# Patient Record
Sex: Male | Born: 1990 | Race: Black or African American | Hispanic: No | Marital: Married | Smoking: Former smoker
Health system: Southern US, Community
[De-identification: ages and names within clinical notes are randomized; demographics above are authoritative.]

## PROBLEM LIST (undated history)

## (undated) HISTORY — PX: NECK SURGERY: SHX720

---
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
## 2012-04-12 ENCOUNTER — Emergency Department: Payer: Self-pay | Admitting: Emergency Medicine

## 2012-10-14 ENCOUNTER — Emergency Department: Payer: Self-pay | Admitting: Emergency Medicine

## 2012-12-28 ENCOUNTER — Emergency Department: Payer: Self-pay | Admitting: Emergency Medicine

## 2013-11-03 ENCOUNTER — Emergency Department: Payer: Self-pay | Admitting: Emergency Medicine

## 2014-03-02 ENCOUNTER — Emergency Department: Payer: Self-pay | Admitting: Emergency Medicine

## 2016-01-01 ENCOUNTER — Emergency Department: Payer: Self-pay

## 2016-01-01 ENCOUNTER — Encounter: Payer: Self-pay | Admitting: Emergency Medicine

## 2016-01-01 DIAGNOSIS — R0789 Other chest pain: Secondary | ICD-10-CM | POA: Insufficient documentation

## 2016-01-01 DIAGNOSIS — F17200 Nicotine dependence, unspecified, uncomplicated: Secondary | ICD-10-CM | POA: Insufficient documentation

## 2016-01-01 LAB — BASIC METABOLIC PANEL
ANION GAP: 5 (ref 5–15)
BUN: 12 mg/dL (ref 6–20)
CHLORIDE: 105 mmol/L (ref 101–111)
CO2: 28 mmol/L (ref 22–32)
Calcium: 9 mg/dL (ref 8.9–10.3)
Creatinine, Ser: 0.86 mg/dL (ref 0.61–1.24)
GFR calc Af Amer: >60 mL/min (ref >60)
GLUCOSE: 100 mg/dL — AB (ref 65–99)
POTASSIUM: 3.8 mmol/L (ref 3.5–5.1)
SODIUM: 138 mmol/L (ref 135–145)

## 2016-01-01 LAB — CBC
HEMATOCRIT: 42.5 % (ref 40.0–52.0)
HEMOGLOBIN: 13.8 g/dL (ref 13.0–18.0)
MCH: 27.4 pg (ref 26.0–34.0)
MCHC: 32.5 g/dL (ref 32.0–36.0)
MCV: 84.4 fL (ref 80.0–100.0)
Platelets: 214 10*3/uL (ref 150–440)
RBC: 5.04 MIL/uL (ref 4.40–5.90)
RDW: 13.4 % (ref 11.5–14.5)
WBC: 6.2 10*3/uL (ref 3.8–10.6)

## 2016-01-01 LAB — TROPONIN I: Troponin I: <0.03 ng/mL (ref <0.03)

## 2016-01-01 NOTE — ED Triage Notes (Signed)
Patient reports intermittent right side chest pain and shortness of breath times one week.

## 2016-01-02 ENCOUNTER — Emergency Department
Admission: EM | Admit: 2016-01-02 | Discharge: 2016-01-02 | Disposition: A | Discharge status: Home / Self Care | Payer: Self-pay | Attending: Emergency Medicine | Admitting: Emergency Medicine

## 2016-01-02 DIAGNOSIS — R079 Chest pain, unspecified: Secondary | ICD-10-CM

## 2016-01-02 MED ORDER — IBUPROFEN 600 MG PO TABS: 600.0000 mg | ORAL_TABLET | Freq: Four times a day (QID) | ORAL | 0 refills | Status: DC | PRN

## 2016-01-02 MED ORDER — CYCLOBENZAPRINE HCL 10 MG PO TABS: 10.0000 mg | ORAL_TABLET | Freq: Three times a day (TID) | ORAL | 0 refills | Status: DC | PRN

## 2016-01-02 NOTE — ED Provider Notes (Signed)
[facility] Emergency Department Provider Note  Time seen: 1:12 AM  I have reviewed the triage vital signs and the nursing notes.   HISTORY  Chief Complaint Chest Pain and Shortness of Breath    HPI Charles Rodriguez is a 25 y.o. male with no past medical history who presents the emergency department right chest pain. According to the patient one week ago he was bench pressing when he felt something pop in his chest. Since that time he has had significant pain to the right chest worse with movement. Patient states the pain worsened tonight while at work so he left work and came to the emergency department for evaluation. Patient denies any nausea, trouble breathing or diaphoresis. Describes the chest pain is mild to moderate at rest, significant when moving.  History reviewed. No pertinent past medical history.  There are no active problems to display for this patient.   History reviewed. No pertinent surgical history.  Prior to Admission medications   Not on File    No Known Allergies  No family history on file.  Social History Social History  Substance Use Topics  . Smoking status: Current Every Day Smoker  . Smokeless tobacco: Never Used  . Alcohol use Not on file    Review of Systems Constitutional: Negative for fever Cardiovascular: Positive for chest pain Respiratory: Negative for shortness of breath. Gastrointestinal: Negative for abdominal pain 10-point ROS otherwise negative.  ____________________________________________   PHYSICAL EXAM:  VITAL SIGNS: ED Triage Vitals  Enc Vitals Group     BP 01/01/16 2222 139/72     Pulse Rate 01/01/16 2222 65     Resp 01/01/16 2222 18     Temp 01/01/16 2222 98 F (36.7 C)     Temp src --      SpO2 01/01/16 2222 99 %     Weight 01/01/16 2222 172 lb (78 kg)     Height 01/01/16 2222 5\' 8"  (1.727 m)     Head Circumference --      Peak Flow --      Pain Score 01/01/16 2223 7     Pain Loc --       Pain Edu? --      Excl. in [place]? --     Constitutional: Alert and oriented. Well appearing and in no distress. Eyes: Normal exam ENT   Head: Normocephalic and atraumatic.   Mouth/Throat: Mucous membranes are moist. Cardiovascular: Normal rate, regular rhythm. No murmur Respiratory: Normal respiratory effort without tachypnea nor retractions. Breath sounds are clear. Moderate right-sided chest wall tenderness palpation.  Gastrointestinal: Soft and nontender. No distention.  Musculoskeletal: Nontender with normal range of motion in all extremities.  Neurologic:  Normal speech and language. No gross focal neurologic deficits Skin:  Skin is warm, dry and intact.  Psychiatric: Mood and affect are normal.  ____________________________________________    EKG  EKG reviewed and interpreted by myself shows normal sinus rhythm at 64 bpm, narrow QRS, normal axis, normal intervals, nonspecific ST changes most consistent with early repolarization.  ____________________________________________    RADIOLOGY  Chest x-ray is negative  ____________________________________________   INITIAL IMPRESSION / ASSESSMENT AND PLAN / ED COURSE  Pertinent labs & imaging results that were available during my care of the patient were reviewed by me and considered in my medical decision making (see chart for details).  Patient presents with right-sided chest pain times one week, worse tonight at work. Patient's labs are largely within normal limits. Troponin is  negative. Chest x-ray is clear. EKG most consistent with early repolarization. Pain is very reproducible on examination. We will discharge with Flexeril and ibuprofen and have the patient follow-up with a primary care doctor in 1 week if not better.  ____________________________________________   FINAL CLINICAL IMPRESSION(S) / ED DIAGNOSES  Chest wall pain    Harvest Dark, MD 01/02/16 [phone]

## 2016-01-02 NOTE — ED Notes (Signed)

## 2016-01-02 NOTE — ED Notes (Signed)
Pt reports intermittent pain to his right chest wall for about 2 weeks. Pt reports pain started while lifting weights and reports he heard a pop in the area. Pt reports pain worse with movement and deep inspirations. Pt respirations even and unlabored at this time and pt is talking in full and complete sentences with no difficulty at this time.

## 2016-03-28 ENCOUNTER — Emergency Department
Admission: EM | Admit: 2016-03-28 | Discharge: 2016-03-28 | Disposition: A | Discharge status: Home / Self Care | Payer: Self-pay | Attending: Emergency Medicine | Admitting: Emergency Medicine

## 2016-03-28 ENCOUNTER — Encounter: Payer: Self-pay | Admitting: Emergency Medicine

## 2016-03-28 DIAGNOSIS — F17290 Nicotine dependence, other tobacco product, uncomplicated: Secondary | ICD-10-CM | POA: Insufficient documentation

## 2016-03-28 DIAGNOSIS — K0889 Other specified disorders of teeth and supporting structures: Secondary | ICD-10-CM

## 2016-03-28 DIAGNOSIS — Z791 Long term (current) use of non-steroidal anti-inflammatories (NSAID): Secondary | ICD-10-CM | POA: Insufficient documentation

## 2016-03-28 DIAGNOSIS — K029 Dental caries, unspecified: Secondary | ICD-10-CM | POA: Insufficient documentation

## 2016-03-28 MED ORDER — TRAMADOL HCL 50 MG PO TABS: 50.0000 mg | ORAL_TABLET | Freq: Four times a day (QID) | ORAL | 0 refills | Status: DC | PRN

## 2016-03-28 MED ORDER — IBUPROFEN 600 MG PO TABS
600.0000 mg | ORAL_TABLET | Freq: Once | ORAL | Status: AC
  Administered 2016-03-28: 600 mg via ORAL
  Filled 2016-03-28: qty 1

## 2016-03-28 MED ORDER — MAGIC MOUTHWASH
5.0000 mL | Freq: Once | ORAL | Status: AC
  Administered 2016-03-28: 5 mL via ORAL
  Filled 2016-03-28: qty 10

## 2016-03-28 MED ORDER — CEPHALEXIN 500 MG PO CAPS: 500.0000 mg | ORAL_CAPSULE | Freq: Four times a day (QID) | ORAL | 0 refills | Status: AC

## 2016-03-28 MED ORDER — CEPHALEXIN 500 MG PO CAPS
500.0000 mg | ORAL_CAPSULE | Freq: Once | ORAL | Status: AC
  Administered 2016-03-28: 500 mg via ORAL
  Filled 2016-03-28: qty 1

## 2016-03-28 MED ORDER — MAGIC MOUTHWASH: 5.0000 mL | Freq: Four times a day (QID) | ORAL | 0 refills | Status: DC | PRN

## 2016-03-28 NOTE — ED Triage Notes (Signed)
Pt presents to ED with right sided tooth pain for the past 2 days. Pain worse tonight that woke him up from sleep. Slight swelling noted. Hx of the same.

## 2016-03-28 NOTE — ED Provider Notes (Signed)
[facility] Emergency Department Provider Note   ____________________________________________   First MD Initiated Contact with Patient 03/28/16 [phone]     (approximate)  I have reviewed the triage vital signs and the nursing notes.   HISTORY  Chief Complaint Dental Pain    HPI Charles Rodriguez is a 25 y.o. male who comes into the hospital today with a toothache. He reports it's hurting very bad on the right side. He reports it has been hurting for a few days and it woke him up out of sleep tonight. Patient thinks his face is swollen. He's had no fevers, nausea or vomiting. He doesn't have a dentist. He has not had any difficulty swelling but reports it hurts to chew on that side. He rates his pain a 10 out of 10 in intensity. He has not taken anything for pain at home. He is here this evening for evaluation   History reviewed. No pertinent past medical history.  There are no active problems to display for this patient.   Past Surgical History:  Procedure Laterality Date  . NECK SURGERY      Prior to Admission medications   Medication Sig Start Date End Date Taking? Authorizing Provider  cephALEXin (KEFLEX) 500 MG capsule Take 1 capsule (500 mg total) by mouth 4 (four) times daily. 03/28/16 04/02/16  Loney Hering, MD  cyclobenzaprine (FLEXERIL) 10 MG tablet Take 1 tablet (10 mg total) by mouth 3 (three) times daily as needed for muscle spasms. 01/02/16   Harvest Dark, MD  ibuprofen (ADVIL,MOTRIN) 600 MG tablet Take 1 tablet (600 mg total) by mouth every 6 (six) hours as needed. 01/02/16   Harvest Dark, MD  magic mouthwash SOLN Take 5 mLs by mouth 4 (four) times daily as needed for mouth pain. 03/28/16   Loney Hering, MD  traMADol (ULTRAM) 50 MG tablet Take 1 tablet (50 mg total) by mouth every 6 (six) hours as needed. 03/28/16   Loney Hering, MD    Allergies Patient has no known allergies.  No family history on file.  Social  History Social History  Substance Use Topics  . Smoking status: Current Every Day Smoker    Types: Cigars  . Smokeless tobacco: Never Used  . Alcohol use Yes    Review of Systems Constitutional: No fever/chills Eyes: No visual changes. ENT: Dental pain Cardiovascular: Denies chest pain. Respiratory: Denies shortness of breath. Gastrointestinal: No abdominal pain.  No nausea, no vomiting.  No diarrhea.  No constipation. Genitourinary: Negative for dysuria. Musculoskeletal: Negative for back pain. Skin: Negative for rash. Neurological: Negative for headaches, focal weakness or numbness.  10-point ROS otherwise negative.  ____________________________________________   PHYSICAL EXAM:  VITAL SIGNS: ED Triage Vitals  Enc Vitals Group     BP 03/28/16 0233 (!) 148/82     Pulse Rate 03/28/16 0233 71     Resp 03/28/16 0233 20     Temp 03/28/16 0233 98.6 F (37 C)     Temp Source 03/28/16 0233 Oral     SpO2 03/28/16 0233 99 %     Weight 03/28/16 0234 174 lb (78.9 kg)     Height 03/28/16 0234 5\' 8"  (1.727 m)     Head Circumference --      Peak Flow --      Pain Score 03/28/16 0233 10     Pain Loc --      Pain Edu? --      Excl. in [place]? --  Constitutional: Alert and oriented. Well appearing and in Moderate distress. Eyes: Conjunctivae are normal. PERRL. EOMI. Head: Atraumatic. Nose: No congestion/rhinnorhea. Mouth/Throat: Mucous membranes are moist.  Oropharynx non-erythematous. Poor dentition with tenderness to palpation to right third molar with some significant decay. Cardiovascular: Normal rate, regular rhythm. Grossly normal heart sounds.  Good peripheral circulation. Respiratory: Normal respiratory effort.  No retractions. Lungs CTAB. Gastrointestinal: Soft and nontender. No distention. Positive bowel sounds Musculoskeletal: No lower extremity tenderness nor edema.   Neurologic:  Normal speech and language.  Skin:  Skin is warm, dry and intact. Marland Kitchen Psychiatric:  Mood and affect are normal.   ____________________________________________   LABS (all labs ordered are listed, but only abnormal results are displayed)  Labs Reviewed - No data to display ____________________________________________  EKG  none ____________________________________________  RADIOLOGY  none ____________________________________________   PROCEDURES  Procedure(s) performed: None  Procedures  Critical Care performed: No  ____________________________________________   INITIAL IMPRESSION / ASSESSMENT AND PLAN / ED COURSE  Pertinent labs & imaging results that were available during my care of the patient were reviewed by me and considered in my medical decision making (see chart for details).  This is a 25 year old male who comes into the hospital today with dental pain. He does have poor dentition and needs to be seen by a dentist. I'll give the patient dose of Keflex as well as Magic mouthwash and some ibuprofen. I will discharge the patient home to follow-up with the dentist for further evaluation of his dental decay.  Clinical Course      ____________________________________________   FINAL CLINICAL IMPRESSION(S) / ED DIAGNOSES  Final diagnoses:  Pain, dental  Dental caries      NEW MEDICATIONS STARTED DURING THIS VISIT:  Discharge Medication List as of 03/28/2016  7:26 AM    START taking these medications   Details  cephALEXin (KEFLEX) 500 MG capsule Take 1 capsule (500 mg total) by mouth 4 (four) times daily., Starting Tue 03/28/2016, Until Sun 04/02/2016, Print    magic mouthwash SOLN Take 5 mLs by mouth 4 (four) times daily as needed for mouth pain., Starting Tue 03/28/2016, Print    traMADol (ULTRAM) 50 MG tablet Take 1 tablet (50 mg total) by mouth every 6 (six) hours as needed., Starting Tue 03/28/2016, Print         Note:  This document was prepared using Dragon voice recognition software and may include unintentional  dictation errors.    Loney Hering, MD 03/28/16 [phone]

## 2016-09-11 ENCOUNTER — Emergency Department
Admission: EM | Admit: 2016-09-11 | Discharge: 2016-09-11 | Disposition: A | Discharge status: Home / Self Care | Payer: Self-pay | Attending: Emergency Medicine | Admitting: Emergency Medicine

## 2016-09-11 ENCOUNTER — Encounter: Payer: Self-pay | Admitting: Emergency Medicine

## 2016-09-11 DIAGNOSIS — F17290 Nicotine dependence, other tobacco product, uncomplicated: Secondary | ICD-10-CM | POA: Insufficient documentation

## 2016-09-11 DIAGNOSIS — Z791 Long term (current) use of non-steroidal anti-inflammatories (NSAID): Secondary | ICD-10-CM | POA: Insufficient documentation

## 2016-09-11 DIAGNOSIS — K0889 Other specified disorders of teeth and supporting structures: Secondary | ICD-10-CM | POA: Insufficient documentation

## 2016-09-11 MED ORDER — LIDOCAINE VISCOUS 2 % MT SOLN
OROMUCOSAL | Status: AC
  Filled 2016-09-11: qty 15

## 2016-09-11 MED ORDER — LIDOCAINE VISCOUS 2 % MT SOLN
15.0000 mL | Freq: Once | OROMUCOSAL | Status: AC
  Administered 2016-09-11: 15 mL via OROMUCOSAL

## 2016-09-11 MED ORDER — IBUPROFEN 600 MG PO TABS
ORAL_TABLET | ORAL | Status: AC
  Filled 2016-09-11: qty 1

## 2016-09-11 MED ORDER — LIDOCAINE VISCOUS 2 % MT SOLN: 5.0000 mL | Freq: Four times a day (QID) | OROMUCOSAL | 0 refills | Status: DC | PRN

## 2016-09-11 MED ORDER — TRAMADOL HCL 50 MG PO TABS: 50.0000 mg | ORAL_TABLET | Freq: Four times a day (QID) | ORAL | 0 refills | Status: DC | PRN

## 2016-09-11 MED ORDER — IBUPROFEN 600 MG PO TABS: 600.0000 mg | ORAL_TABLET | Freq: Four times a day (QID) | ORAL | 0 refills | Status: DC | PRN

## 2016-09-11 MED ORDER — IBUPROFEN 600 MG PO TABS
600.0000 mg | ORAL_TABLET | Freq: Once | ORAL | Status: AC
  Administered 2016-09-11: 600 mg via ORAL

## 2016-09-11 MED ORDER — AMOXICILLIN 500 MG PO CAPS: 500.0000 mg | ORAL_CAPSULE | Freq: Three times a day (TID) | ORAL | 0 refills | Status: DC

## 2016-09-11 NOTE — ED Triage Notes (Signed)
L lower dental pain x 5 days.

## 2016-09-11 NOTE — ED Notes (Signed)
See triage note   States he developed tooth pain about 4-5 days ago  States tooth is loose not sure if it has a cavity

## 2016-09-11 NOTE — ED Provider Notes (Signed)
[facility] Emergency Department Provider Note   ____________________________________________   None    (approximate)  I have reviewed the triage vital signs and the nursing notes.   HISTORY  Chief Complaint Dental Pain    HPI Charles Rodriguez is a 26 y.o. male patient complain of dental pain for 5 days. Patient state has devitalized and loose tooth left lower molar area.Patient rates his pain as a 10 over 10. Patient described a pain as "sharp". Patient states no relief with over-the-counter anti-inflammatory medications.   History reviewed. No pertinent past medical history.  There are no active problems to display for this patient.   Past Surgical History:  Procedure Laterality Date  . NECK SURGERY      Prior to Admission medications   Medication Sig Start Date End Date Taking? Authorizing Provider  amoxicillin (AMOXIL) 500 MG capsule Take 1 capsule (500 mg total) by mouth 3 (three) times daily. 09/11/16   Sable Feil, PA-C  cyclobenzaprine (FLEXERIL) 10 MG tablet Take 1 tablet (10 mg total) by mouth 3 (three) times daily as needed for muscle spasms. 01/02/16   Harvest Dark, MD  ibuprofen (ADVIL,MOTRIN) 600 MG tablet Take 1 tablet (600 mg total) by mouth every 6 (six) hours as needed. 01/02/16   Harvest Dark, MD  ibuprofen (ADVIL,MOTRIN) 600 MG tablet Take 1 tablet (600 mg total) by mouth every 6 (six) hours as needed. 09/11/16   Sable Feil, PA-C  lidocaine (XYLOCAINE) 2 % solution Use as directed 5 mLs in the mouth or throat every 6 (six) hours as needed for mouth pain. 09/11/16   Sable Feil, PA-C  magic mouthwash SOLN Take 5 mLs by mouth 4 (four) times daily as needed for mouth pain. 03/28/16   Loney Hering, MD  traMADol (ULTRAM) 50 MG tablet Take 1 tablet (50 mg total) by mouth every 6 (six) hours as needed. 03/28/16   Loney Hering, MD  traMADol (ULTRAM) 50 MG tablet Take 1 tablet (50 mg total) by mouth every 6  (six) hours as needed for moderate pain. 09/11/16   Sable Feil, PA-C    Allergies Patient has no known allergies.  No family history on file.  Social History Social History  Substance Use Topics  . Smoking status: Current Every Day Smoker    Types: Cigars  . Smokeless tobacco: Never Used  . Alcohol use Yes    Review of Systems  Constitutional: No fever/chills Eyes: No visual changes. ENT: No sore throat. Dental pain Cardiovascular: Denies chest pain. Respiratory: Denies shortness of breath. Gastrointestinal: No abdominal pain.  No nausea, no vomiting.  No diarrhea.  No constipation. Genitourinary: Negative for dysuria. Musculoskeletal: Negative for back pain. Skin: Negative for rash. Neurological: Negative for headaches, focal weakness or numbness.  ____________________________________________   PHYSICAL EXAM:  VITAL SIGNS: ED Triage Vitals [09/11/16 0703]  Enc Vitals Group     BP 128/63     Pulse Rate 70     Resp 18     Temp 97.9 F (36.6 C)     Temp Source Oral     SpO2 96 %     Weight 169 lb (76.7 kg)     Height 5\' 8"  (1.727 m)     Head Circumference      Peak Flow      Pain Score 10     Pain Loc      Pain Edu?      Excl. in [place]?  Constitutional: Alert and oriented. Well appearing and in no acute distress. Eyes: Conjunctivae are normal. PERRL. EOMI. Head: Atraumatic. Nose: No congestion/rhinnorhea. Mouth/Throat: Mucous membranes are moist.  Oropharynx non-erythematous. Devitalized tooth #18 Neck: No stridor.  No cervical spine tenderness to palpation. Hematological/Lymphatic/Immunilogical: No cervical lymphadenopathy. Cardiovascular: Normal rate, regular rhythm. Grossly normal heart sounds.  Good peripheral circulation. Respiratory: Normal respiratory effort.  No retractions. Lungs CTAB. Gastrointestinal: Soft and nontender. No distention. No abdominal bruits. No CVA tenderness. Musculoskeletal: No lower extremity tenderness nor edema.  No  joint effusions. Neurologic:  Normal speech and language. No gross focal neurologic deficits are appreciated. No gait instability. Skin:  Skin is warm, dry and intact. No rash noted. Psychiatric: Mood and affect are normal. Speech and behavior are normal.  ____________________________________________   LABS (all labs ordered are listed, but only abnormal results are displayed)  Labs Reviewed - No data to display ____________________________________________  EKG   ____________________________________________  RADIOLOGY   ____________________________________________   PROCEDURES  Procedure(s) performed: None  Procedures  Critical Care performed: No  ____________________________________________   INITIAL IMPRESSION / ASSESSMENT AND PLAN / ED COURSE  Pertinent labs & imaging results that were available during my care of the patient were reviewed by me and considered in my medical decision making (see chart for details).  Dental pain secondary to devitalize tooth #18. Patient given discharge care instructions. Patient advised to follow-up this morning with the walk-in dental clinic.      ____________________________________________   FINAL CLINICAL IMPRESSION(S) / ED DIAGNOSES  Final diagnoses:  Pain, dental      NEW MEDICATIONS STARTED DURING THIS VISIT:  New Prescriptions   AMOXICILLIN (AMOXIL) 500 MG CAPSULE    Take 1 capsule (500 mg total) by mouth 3 (three) times daily.   IBUPROFEN (ADVIL,MOTRIN) 600 MG TABLET    Take 1 tablet (600 mg total) by mouth every 6 (six) hours as needed.   LIDOCAINE (XYLOCAINE) 2 % SOLUTION    Use as directed 5 mLs in the mouth or throat every 6 (six) hours as needed for mouth pain.   TRAMADOL (ULTRAM) 50 MG TABLET    Take 1 tablet (50 mg total) by mouth every 6 (six) hours as needed for moderate pain.     Note:  This document was prepared using Dragon voice recognition software and may include unintentional dictation  errors.    Sable Feil, PA-C 09/11/16 [place], [place], MD 09/11/16 1330

## 2017-03-05 ENCOUNTER — Emergency Department: Payer: Self-pay

## 2017-03-05 ENCOUNTER — Encounter: Payer: Self-pay | Admitting: *Deleted

## 2017-03-05 ENCOUNTER — Emergency Department
Admission: EM | Admit: 2017-03-05 | Discharge: 2017-03-05 | Disposition: A | Discharge status: Home / Self Care | Payer: Self-pay | Attending: Emergency Medicine | Admitting: Emergency Medicine

## 2017-03-05 ENCOUNTER — Other Ambulatory Visit: Payer: Self-pay

## 2017-03-05 DIAGNOSIS — Y999 Unspecified external cause status: Secondary | ICD-10-CM | POA: Insufficient documentation

## 2017-03-05 DIAGNOSIS — S0990XA Unspecified injury of head, initial encounter: Secondary | ICD-10-CM | POA: Insufficient documentation

## 2017-03-05 DIAGNOSIS — Y9361 Activity, american tackle football: Secondary | ICD-10-CM | POA: Insufficient documentation

## 2017-03-05 DIAGNOSIS — Y929 Unspecified place or not applicable: Secondary | ICD-10-CM | POA: Insufficient documentation

## 2017-03-05 DIAGNOSIS — Z79899 Other long term (current) drug therapy: Secondary | ICD-10-CM | POA: Insufficient documentation

## 2017-03-05 DIAGNOSIS — W500XXA Accidental hit or strike by another person, initial encounter: Secondary | ICD-10-CM | POA: Insufficient documentation

## 2017-03-05 MED ORDER — ACETAMINOPHEN 500 MG PO TABS
1000.0000 mg | ORAL_TABLET | Freq: Once | ORAL | Status: AC
  Administered 2017-03-05: 1000 mg via ORAL
  Filled 2017-03-05: qty 2

## 2017-03-05 MED ORDER — ACETAMINOPHEN 500 MG PO TABS
ORAL_TABLET | ORAL | Status: AC
  Administered 2017-03-05: 1000 mg via ORAL
  Filled 2017-03-05: qty 2

## 2017-03-05 NOTE — ED Provider Notes (Signed)
[facility] Emergency Department Provider Note  ____________________________________________   First MD Initiated Contact with Patient 03/05/17 1844     (approximate)  I have reviewed the triage vital signs and the nursing notes.   HISTORY  Chief Complaint Head Injury    HPI Charles Rodriguez is a 26 y.o. male with no chronic medical problems who presents for evaluation after head injury yesterday.  He was playing football yesterday and was elbowed behind his ear on the right side.  He did not lose consciousness but felt briefly dazed.  He has had no nausea or vomiting.  He has had a persistent dull generalized headache that he describes as mild since that time but he was worried that something else was going on.  He feels like his vision was blurry briefly.  He denies nausea, vomiting, chest pain, shortness of breath.  He has had no difficulty with ambulation, no weakness or numbness in his extremities, in no confusion (confirmed by his wife).  No history of prior head injuries.  History reviewed. No pertinent past medical history.  There are no active problems to display for this patient.   Past Surgical History:  Procedure Laterality Date  . NECK SURGERY      Prior to Admission medications   Medication Sig Start Date End Date Taking? Authorizing Provider  amoxicillin (AMOXIL) 500 MG capsule Take 1 capsule (500 mg total) by mouth 3 (three) times daily. 09/11/16   Sable Feil, PA-C  cyclobenzaprine (FLEXERIL) 10 MG tablet Take 1 tablet (10 mg total) by mouth 3 (three) times daily as needed for muscle spasms. 01/02/16   Harvest Dark, MD  ibuprofen (ADVIL,MOTRIN) 600 MG tablet Take 1 tablet (600 mg total) by mouth every 6 (six) hours as needed. 01/02/16   Harvest Dark, MD  ibuprofen (ADVIL,MOTRIN) 600 MG tablet Take 1 tablet (600 mg total) by mouth every 6 (six) hours as needed. 09/11/16   Sable Feil, PA-C  lidocaine (XYLOCAINE) 2 % solution Use  as directed 5 mLs in the mouth or throat every 6 (six) hours as needed for mouth pain. 09/11/16   Sable Feil, PA-C  magic mouthwash SOLN Take 5 mLs by mouth 4 (four) times daily as needed for mouth pain. 03/28/16   Loney Hering, MD  traMADol (ULTRAM) 50 MG tablet Take 1 tablet (50 mg total) by mouth every 6 (six) hours as needed. 03/28/16   Loney Hering, MD  traMADol (ULTRAM) 50 MG tablet Take 1 tablet (50 mg total) by mouth every 6 (six) hours as needed for moderate pain. 09/11/16   Sable Feil, PA-C    Allergies Patient has no known allergies.  No family history on file.  Social History Social History   Tobacco Use  . Smoking status: Current Every Day Smoker    Types: Cigars  . Smokeless tobacco: Never Used  Substance Use Topics  . Alcohol use: Yes  . Drug use: No    Review of Systems Constitutional: No fever/chills Cardiovascular: Denies chest pain. Respiratory: Denies shortness of breath. Gastrointestinal: No abdominal pain.  No nausea, no vomiting.  No diarrhea.  No constipation. Genitourinary: Negative for dysuria. Musculoskeletal: Mild generalized headache.  Negative for neck pain.  Negative for back pain. Integumentary: Negative for rash. Neurological: Mild generalized headache.  No focal weakness or numbness.   ____________________________________________   PHYSICAL EXAM:  VITAL SIGNS: ED Triage Vitals  Enc Vitals Group     BP 03/05/17 1457 Marland Kitchen)  148/72     Pulse Rate 03/05/17 1457 71     Resp 03/05/17 1457 20     Temp 03/05/17 1457 98.6 F (37 C)     Temp Source 03/05/17 1457 Oral     SpO2 03/05/17 1457 99 %     Weight 03/05/17 1457 77.1 kg (170 lb)     Height 03/05/17 1457 1.753 m (5\' 9" )     Head Circumference --      Peak Flow --      Pain Score 03/05/17 1456 6     Pain Loc --      Pain Edu? --      Excl. in [place]? --     Constitutional: Alert and oriented. Well appearing and in no acute distress. Eyes: Conjunctivae are normal. No  "raccoon eyes". Head: Mildly tender hematoma posterior to right ear.  No evidence of skull fracture. Nose: No congestion/rhinnorhea. Neck: No stridor.  No meningeal signs.  No cervical spine tenderness to palpation. Cardiovascular: Normal rate, regular rhythm. Good peripheral circulation. Grossly normal heart sounds. Respiratory: Normal respiratory effort.  No retractions. Lungs CTAB. Gastrointestinal: Soft and nontender. No distention.  Musculoskeletal: No lower extremity tenderness nor edema. No gross deformities of extremities. Neurologic:  Normal speech and language. No gross focal neurologic deficits are appreciated.  Skin:  Skin is warm, dry and intact. No rash noted. Psychiatric: Mood and affect are normal. Speech and behavior are normal.  ____________________________________________   LABS (all labs ordered are listed, but only abnormal results are displayed)  Labs Reviewed - No data to display ____________________________________________  EKG  None - EKG not ordered by ED physician ____________________________________________  RADIOLOGY   Ct Head Wo Contrast  Result Date: 03/05/2017 CLINICAL DATA:  Posttraumatic headache after injury playing football yesterday. No loss of consciousness. EXAM: CT HEAD WITHOUT CONTRAST TECHNIQUE: Contiguous axial images were obtained from the base of the skull through the vertex without intravenous contrast. COMPARISON:  None. FINDINGS: Brain: No evidence of acute infarction, hemorrhage, hydrocephalus, extra-axial collection or mass lesion/mass effect. Vascular: No hyperdense vessel or unexpected calcification. Skull: Normal. Negative for fracture or focal lesion. Sinuses/Orbits: No acute finding. Other: None. IMPRESSION: Normal head CT. Electronically Signed   By: Marijo Conception, M.D.   On: 03/05/2017 15:19    ____________________________________________   PROCEDURES  Critical Care performed: No   Procedure(s) performed:    Procedures   ____________________________________________   INITIAL IMPRESSION / ASSESSMENT AND PLAN / ED COURSE  As part of my medical decision making, I reviewed the following data within the [place] [facility] notes reviewed and incorporated    Differential diagnosis includes skull fracture, intracranial hemorrhage, concussion, etc.  Fortunately the patient has no clinical signs of concussion other than a mild headache and his CT scan is reassuring.  He has no focal neurological deficits.  No evidence of basilar skull fracture which would require a CT temporal bone.  I had my usual and customary head injury discussion with him and gave my usual post injury precautions.  He understands and agrees with the plan.     ____________________________________________  FINAL CLINICAL IMPRESSION(S) / ED DIAGNOSES  Final diagnoses:  Minor head injury, initial encounter     MEDICATIONS GIVEN DURING THIS VISIT:  Medications  acetaminophen (TYLENOL) tablet 1,000 mg (1,000 mg Oral Given 03/05/17 1816)       Note:  This document was prepared using Dragon voice recognition software and may include unintentional dictation errors.  Hinda Kehr, MD 03/05/17 [phone]

## 2017-03-05 NOTE — Discharge Instructions (Signed)
You were seen in the Emergency Department (ED) today for a head injury.  Your evaluation was reassuring and there is no evidence that you sustained a serious injury.  Symptoms to expect from a minor head injury include nausea, mild to moderate headache, difficulty concentrating or sleeping, and mild lightheadedness.  These symptoms should improve over the next few days to weeks, but it may take many weeks before you feel back to normal.  Return to the emergency department or follow-up with your primary care doctor if your symptoms are not improving over this time.  Signs of a more serious head injury include vomiting, severe headache, excessive sleepiness or confusion, and weakness or numbness in your face, arms or legs.  Return immediately to the Emergency Department if you experience any of these more concerning symptoms.    Rest, avoid strenuous physical or mental activity, and avoid activities that could potentially result in another head injury until all your symptoms from this head injury are completely resolved for at least 2-3 weeks.  If you participate in sports, get cleared by your doctor or trainer before returning to play.  You may take ibuprofen or acetaminophen over the counter according to label instructions for mild headache or scalp soreness.

## 2017-03-05 NOTE — ED Notes (Signed)
Pt states he was at football workouts at [employer] yesterday denies LOC, pt states since has had a HA with blurry vision. Pt is ambulatory to ED35 from the [place] without any difficulty. Pt is a/ox4.Marland Kitchen

## 2017-03-05 NOTE — ED Triage Notes (Signed)
Pt was playing football yesterday when he got elbowed on the right side of his head, pt has a visible hematoma, pt denies LOC , complains of a headache

## 2017-03-05 NOTE — ED Notes (Signed)
Pt ambulatory upon discharge. Pt and family verbalized understanding of discharge instructions and follow-up care. Pt A&O x4. Skin warm and dry. VSS.

## 2017-04-06 ENCOUNTER — Emergency Department
Admission: EM | Admit: 2017-04-06 | Discharge: 2017-04-06 | Disposition: A | Discharge status: Home / Self Care | Payer: Self-pay | Attending: Emergency Medicine | Admitting: Emergency Medicine

## 2017-04-06 ENCOUNTER — Encounter: Payer: Self-pay | Admitting: Emergency Medicine

## 2017-04-06 DIAGNOSIS — M791 Myalgia, unspecified site: Secondary | ICD-10-CM | POA: Insufficient documentation

## 2017-04-06 DIAGNOSIS — R6889 Other general symptoms and signs: Secondary | ICD-10-CM

## 2017-04-06 DIAGNOSIS — R070 Pain in throat: Secondary | ICD-10-CM | POA: Insufficient documentation

## 2017-04-06 DIAGNOSIS — F17210 Nicotine dependence, cigarettes, uncomplicated: Secondary | ICD-10-CM | POA: Insufficient documentation

## 2017-04-06 DIAGNOSIS — R0981 Nasal congestion: Secondary | ICD-10-CM | POA: Insufficient documentation

## 2017-04-06 DIAGNOSIS — R05 Cough: Secondary | ICD-10-CM | POA: Insufficient documentation

## 2017-04-06 DIAGNOSIS — Z79899 Other long term (current) drug therapy: Secondary | ICD-10-CM | POA: Insufficient documentation

## 2017-04-06 MED ORDER — FLUTICASONE PROPIONATE 50 MCG/ACT NA SUSP: 2.0000 | Freq: Every day | NASAL | 0 refills | Status: DC

## 2017-04-06 MED ORDER — ACETAMINOPHEN 500 MG PO TABS: 500.0000 mg | ORAL_TABLET | Freq: Four times a day (QID) | ORAL | 0 refills | Status: DC | PRN

## 2017-04-06 MED ORDER — LIDOCAINE VISCOUS 2 % MT SOLN: 10.0000 mL | OROMUCOSAL | 0 refills | Status: DC | PRN

## 2017-04-06 MED ORDER — IBUPROFEN 600 MG PO TABS: 600.0000 mg | ORAL_TABLET | Freq: Four times a day (QID) | ORAL | 0 refills | Status: DC | PRN

## 2017-04-06 MED ORDER — BENZONATATE 100 MG PO CAPS: 100.0000 mg | ORAL_CAPSULE | Freq: Three times a day (TID) | ORAL | 0 refills | Status: AC | PRN

## 2017-04-06 NOTE — ED Provider Notes (Signed)
[facility] Emergency Department Provider Note  ____________________________________________  Time seen: Approximately 12:20 PM  I have reviewed the triage vital signs and the nursing notes.   HISTORY  Chief Complaint Flu-like Symptoms    HPI Charles Rodriguez is a 26 y.o. male presents to emergency department for evaluation of muscle aches, chills, sore throat, nasal congestion, nonproductive cough.  Patient states that he thinks he has the flu but does not know the symptoms that he came to the emergency department.  His "muscles hurt all over." He is not sure if he has had a fever but is waking up sweating.  He is eating and drinking well.  Patient did not receive flu shot this year.  He smokes 1 black cigar/day.  No sick contacts.  No nausea, vomiting, abdominal pain, you have diarrhea, constipation.   History reviewed. No pertinent past medical history.  There are no active problems to display for this patient.   Past Surgical History:  Procedure Laterality Date  . NECK SURGERY      Prior to Admission medications   Medication Sig Start Date End Date Taking? Authorizing Provider  acetaminophen (TYLENOL) 500 MG tablet Take 1 tablet (500 mg total) by mouth every 6 (six) hours as needed. 04/06/17   Laban Emperor, PA-C  amoxicillin (AMOXIL) 500 MG capsule Take 1 capsule (500 mg total) by mouth 3 (three) times daily. 09/11/16   Sable Feil, PA-C  benzonatate (TESSALON PERLES) 100 MG capsule Take 1 capsule (100 mg total) by mouth 3 (three) times daily as needed for cough. 04/06/17 04/06/18  Laban Emperor, PA-C  cyclobenzaprine (FLEXERIL) 10 MG tablet Take 1 tablet (10 mg total) by mouth 3 (three) times daily as needed for muscle spasms. 01/02/16   Harvest Dark, MD  fluticasone (FLONASE) 50 MCG/ACT nasal spray Place 2 sprays into both nostrils daily. 04/06/17 04/06/18  Laban Emperor, PA-C  ibuprofen (ADVIL,MOTRIN) 600 MG tablet Take 1 tablet (600 mg total) by  mouth every 6 (six) hours as needed. 04/06/17   Laban Emperor, PA-C  lidocaine (XYLOCAINE) 2 % solution Use as directed 10 mLs in the mouth or throat as needed for mouth pain. 04/06/17   Laban Emperor, PA-C  magic mouthwash SOLN Take 5 mLs by mouth 4 (four) times daily as needed for mouth pain. 03/28/16   Loney Hering, MD  traMADol (ULTRAM) 50 MG tablet Take 1 tablet (50 mg total) by mouth every 6 (six) hours as needed. 03/28/16   Loney Hering, MD  traMADol (ULTRAM) 50 MG tablet Take 1 tablet (50 mg total) by mouth every 6 (six) hours as needed for moderate pain. 09/11/16   Sable Feil, PA-C    Allergies Patient has no known allergies.  No family history on file.  Social History Social History   Tobacco Use  . Smoking status: Current Every Day Smoker    Types: Cigars  . Smokeless tobacco: Never Used  Substance Use Topics  . Alcohol use: Yes  . Drug use: No     Review of Systems  Cardiovascular: No chest pain. Respiratory: No SOB. Gastrointestinal: No abdominal pain.  No nausea, no vomiting.  Skin: Negative for rash, abrasions, lacerations, ecchymosis.   ____________________________________________   PHYSICAL EXAM:  VITAL SIGNS: ED Triage Vitals [04/06/17 1138]  Enc Vitals Group     BP (!) 133/91     Pulse Rate 91     Resp 16     Temp 98.9 F (37.2 C)  Temp Source Oral     SpO2 98 %     Weight 170 lb (77.1 kg)     Height 5\' 11"  (1.803 m)     Head Circumference      Peak Flow      Pain Score      Pain Loc      Pain Edu?      Excl. in [place]?      Constitutional: Alert and oriented. Well appearing and in no acute distress. Eyes: Conjunctivae are normal. PERRL. EOMI. Head: Atraumatic. ENT:      Ears:      Nose: No congestion/rhinnorhea.      Mouth/Throat: Mucous membranes are moist.  Neck: No stridor.   Cardiovascular: Normal rate, regular rhythm.  Good peripheral circulation. Respiratory: Normal respiratory effort without tachypnea or  retractions. Lungs CTAB. Good air entry to the bases with no decreased or absent breath sounds. Gastrointestinal: Bowel sounds 4 quadrants. Soft and nontender to palpation. No guarding or rigidity. No palpable masses. No distention. Musculoskeletal: Full range of motion to all extremities. No gross deformities appreciated. Neurologic:  Normal speech and language. No gross focal neurologic deficits are appreciated.  Skin:  Skin is warm, dry and intact. No rash noted.   ____________________________________________   LABS (all labs ordered are listed, but only abnormal results are displayed)  Labs Reviewed - No data to display ____________________________________________  EKG   ____________________________________________  RADIOLOGY No results found.  ____________________________________________    PROCEDURES  Procedure(s) performed:    Procedures    Medications - No data to display   ____________________________________________   INITIAL IMPRESSION / ASSESSMENT AND PLAN / ED COURSE  Pertinent labs & imaging results that were available during my care of the patient were reviewed by me and considered in my medical decision making (see chart for details).  Review of the [place] CSRS was performed in accordance of the [place] prior to dispensing any controlled drugs.   Patient's diagnosis is consistent with influenza like illness.  Vital signs and exam are reassuring.  Patient is already outside of the window to begin Tamiflu so we discussed not completing a fluid test since it will not change management.  Patient appears well.  Patient will be discharged home with prescriptions for Flonase, viscous lidocaine, Tessalon Perles, Tylenol, ibuprofen. Patient is to follow up with PCP as directed. Patient is given ED precautions to return to the ED for any worsening or new symptoms.     ____________________________________________  FINAL CLINICAL IMPRESSION(S) / ED  DIAGNOSES  Final diagnoses:  Flu-like symptoms      NEW MEDICATIONS STARTED DURING THIS VISIT:  ED Discharge Orders        Ordered    fluticasone (FLONASE) 50 MCG/ACT nasal spray  Daily     04/06/17 1228    benzonatate (TESSALON PERLES) 100 MG capsule  3 times daily PRN     04/06/17 1228    lidocaine (XYLOCAINE) 2 % solution  As needed     04/06/17 1228    ibuprofen (ADVIL,MOTRIN) 600 MG tablet  Every 6 hours PRN     04/06/17 1228    acetaminophen (TYLENOL) 500 MG tablet  Every 6 hours PRN     04/06/17 1228          This chart was dictated using voice recognition software/Dragon. Despite best efforts to proofread, errors can occur which can change the meaning. Any change was purely unintentional.    Laban Emperor, PA-C 04/06/17 1250  Lisa Roca, MD 04/06/17 1341

## 2017-04-06 NOTE — ED Triage Notes (Signed)
Patient presents to ED via POV from home with c/o generalized body aches, sore throat and chills x 3 days. Ambulatory to triage.

## 2017-07-03 ENCOUNTER — Encounter: Payer: Self-pay | Admitting: Emergency Medicine

## 2017-07-03 ENCOUNTER — Emergency Department
Admission: EM | Admit: 2017-07-03 | Discharge: 2017-07-03 | Disposition: A | Discharge status: Home / Self Care | Payer: Self-pay | Attending: Emergency Medicine | Admitting: Emergency Medicine

## 2017-07-03 ENCOUNTER — Other Ambulatory Visit: Payer: Self-pay

## 2017-07-03 DIAGNOSIS — F17290 Nicotine dependence, other tobacco product, uncomplicated: Secondary | ICD-10-CM | POA: Insufficient documentation

## 2017-07-03 DIAGNOSIS — K529 Noninfective gastroenteritis and colitis, unspecified: Secondary | ICD-10-CM | POA: Insufficient documentation

## 2017-07-03 LAB — CBC
HEMATOCRIT: 45.2 % (ref 40.0–52.0)
HEMOGLOBIN: 14.7 g/dL (ref 13.0–18.0)
MCH: 26.7 pg (ref 26.0–34.0)
MCHC: 32.4 g/dL (ref 32.0–36.0)
MCV: 82.4 fL (ref 80.0–100.0)
Platelets: 218 10*3/uL (ref 150–440)
RBC: 5.49 MIL/uL (ref 4.40–5.90)
RDW: 14 % (ref 11.5–14.5)
WBC: 9.7 10*3/uL (ref 3.8–10.6)

## 2017-07-03 LAB — COMPREHENSIVE METABOLIC PANEL
ALBUMIN: 4.5 g/dL (ref 3.5–5.0)
ALT: 22 U/L (ref 17–63)
ANION GAP: 6 (ref 5–15)
AST: 30 U/L (ref 15–41)
Alkaline Phosphatase: 62 U/L (ref 38–126)
BILIRUBIN TOTAL: 1.2 mg/dL (ref 0.3–1.2)
BUN: 13 mg/dL (ref 6–20)
CHLORIDE: 103 mmol/L (ref 101–111)
CO2: 27 mmol/L (ref 22–32)
Calcium: 8.7 mg/dL — ABNORMAL LOW (ref 8.9–10.3)
Creatinine, Ser: 0.9 mg/dL (ref 0.61–1.24)
GFR calc Af Amer: >60 mL/min (ref >60)
GFR calc non Af Amer: >60 mL/min (ref >60)
GLUCOSE: 88 mg/dL (ref 65–99)
Potassium: 4.2 mmol/L (ref 3.5–5.1)
SODIUM: 136 mmol/L (ref 135–145)
TOTAL PROTEIN: 7.3 g/dL (ref 6.5–8.1)

## 2017-07-03 LAB — URINALYSIS, COMPLETE (UACMP) WITH MICROSCOPIC
BACTERIA UA: NONE SEEN
Bilirubin Urine: NEGATIVE
GLUCOSE, UA: NEGATIVE mg/dL
HGB URINE DIPSTICK: NEGATIVE
Ketones, ur: NEGATIVE mg/dL
Leukocytes, UA: NEGATIVE
NITRITE: NEGATIVE
PROTEIN: NEGATIVE mg/dL
Specific Gravity, Urine: 1.03 (ref 1.005–1.030)
pH: 6 (ref 5.0–8.0)

## 2017-07-03 LAB — LIPASE, BLOOD: LIPASE: 24 U/L (ref 11–51)

## 2017-07-03 MED ORDER — ONDANSETRON HCL 4 MG PO TABS: 4.0000 mg | ORAL_TABLET | Freq: Three times a day (TID) | ORAL | 0 refills | Status: DC | PRN

## 2017-07-03 MED ORDER — ONDANSETRON 4 MG PO TBDP
4.0000 mg | ORAL_TABLET | Freq: Once | ORAL | Status: AC | PRN
  Administered 2017-07-03: 4 mg via ORAL
  Filled 2017-07-03: qty 1

## 2017-07-03 NOTE — ED Provider Notes (Signed)
[facility] Emergency Department Provider Note  ____________________________________________   I have reviewed the triage vital signs and the nursing notes. Where available I have reviewed prior notes and, if possible and indicated, outside hospital notes.    HISTORY  Chief Complaint Emesis and Diarrhea    HPI Charles Rodriguez is a 27 y.o. male who is largely healthy, states he has had nausea vomiting diarrhea for the last 9 hours or so.  No bleeding, no significant abdominal pain.  He needs a work note.  Multiple sick contacts no recent travel no recent antibiotics. Vomited 3 times and had several different watery diarrheas.  Got Zofran from triage and feels much better.  Would like to go home if possible.   History reviewed. No pertinent past medical history.  There are no active problems to display for this patient.   Past Surgical History:  Procedure Laterality Date  . NECK SURGERY      Prior to Admission medications   Medication Sig Start Date End Date Taking? Authorizing Provider  acetaminophen (TYLENOL) 500 MG tablet Take 1 tablet (500 mg total) by mouth every 6 (six) hours as needed. 04/06/17   Laban Emperor, PA-C  amoxicillin (AMOXIL) 500 MG capsule Take 1 capsule (500 mg total) by mouth 3 (three) times daily. 09/11/16   Sable Feil, PA-C  benzonatate (TESSALON PERLES) 100 MG capsule Take 1 capsule (100 mg total) by mouth 3 (three) times daily as needed for cough. 04/06/17 04/06/18  Laban Emperor, PA-C  cyclobenzaprine (FLEXERIL) 10 MG tablet Take 1 tablet (10 mg total) by mouth 3 (three) times daily as needed for muscle spasms. 01/02/16   Harvest Dark, MD  fluticasone (FLONASE) 50 MCG/ACT nasal spray Place 2 sprays into both nostrils daily. 04/06/17 04/06/18  Laban Emperor, PA-C  ibuprofen (ADVIL,MOTRIN) 600 MG tablet Take 1 tablet (600 mg total) by mouth every 6 (six) hours as needed. 04/06/17   Laban Emperor, PA-C  lidocaine (XYLOCAINE) 2 %  solution Use as directed 10 mLs in the mouth or throat as needed for mouth pain. 04/06/17   Laban Emperor, PA-C  magic mouthwash SOLN Take 5 mLs by mouth 4 (four) times daily as needed for mouth pain. 03/28/16   Loney Hering, MD  traMADol (ULTRAM) 50 MG tablet Take 1 tablet (50 mg total) by mouth every 6 (six) hours as needed. 03/28/16   Loney Hering, MD  traMADol (ULTRAM) 50 MG tablet Take 1 tablet (50 mg total) by mouth every 6 (six) hours as needed for moderate pain. 09/11/16   Sable Feil, PA-C    Allergies Patient has no known allergies.  History reviewed. No pertinent family history.  Social History Social History   Tobacco Use  . Smoking status: Current Every Day Smoker    Packs/day: 1.00    Types: Cigars  . Smokeless tobacco: Never Used  Substance Use Topics  . Alcohol use: Yes    Comment: occasional  . Drug use: No    Review of Systems Constitutional: No fever/chills Eyes: No visual changes. ENT: No sore throat. No stiff neck no neck pain Cardiovascular: Denies chest pain. Respiratory: Denies shortness of breath. Gastrointestinal:     No constipation. Genitourinary: Negative for dysuria. Musculoskeletal: Negative lower extremity swelling Skin: Negative for rash. Neurological: Negative for severe headaches, focal weakness or numbness.   ____________________________________________   PHYSICAL EXAM:  VITAL SIGNS: ED Triage Vitals  Enc Vitals Group     BP 07/03/17 1509 (!) 142/66  Pulse Rate 07/03/17 1509 89     Resp 07/03/17 1509 18     Temp 07/03/17 1509 97.8 F (36.6 C)     Temp Source 07/03/17 1509 Oral     SpO2 07/03/17 1509 98 %     Weight 07/03/17 1510 170 lb (77.1 kg)     Height 07/03/17 1510 5\' 8"  (1.727 m)     Head Circumference --      Peak Flow --      Pain Score 07/03/17 1510 0     Pain Loc --      Pain Edu? --      Excl. in [place]? --     Constitutional: Alert and oriented. Well appearing and in no acute distress. Eyes:  Conjunctivae are normal Head: Atraumatic HEENT: No congestion/rhinnorhea. Mucous membranes are moist.  Oropharynx non-erythematous Neck:   Nontender with no meningismus, no masses, no stridor Cardiovascular: Normal rate, regular rhythm. Grossly normal heart sounds.  Good peripheral circulation. Respiratory: Normal respiratory effort.  No retractions. Lungs CTAB. Abdominal: Soft and nontender. No distention. No guarding no rebound Back:  There is no focal tenderness or step off.  there is no midline tenderness there are no lesions noted. there is no CVA tenderness Musculoskeletal: No lower extremity tenderness, no upper extremity tenderness. No joint effusions, no DVT signs strong distal pulses no edema Neurologic:  Normal speech and language. No gross focal neurologic deficits are appreciated.  Skin:  Skin is warm, dry and intact. No rash noted. Psychiatric: Mood and affect are normal. Speech and behavior are normal.  ____________________________________________   LABS (all labs ordered are listed, but only abnormal results are displayed)  Labs Reviewed  COMPREHENSIVE METABOLIC PANEL - Abnormal; Notable for the following components:      Result Value   Calcium 8.7 (*)    All other components within normal limits  URINALYSIS, COMPLETE (UACMP) WITH MICROSCOPIC - Abnormal; Notable for the following components:   Color, Urine YELLOW (*)    APPearance CLEAR (*)    Squamous Epithelial / LPF 0-5 (*)    All other components within normal limits  LIPASE, BLOOD  CBC    Pertinent labs  results that were available during my care of the patient were reviewed by me and considered in my medical decision making (see chart for details). ____________________________________________  EKG  I personally interpreted any EKGs ordered by me or triage  ____________________________________________  RADIOLOGY  Pertinent labs & imaging results that were available during my care of the patient were  reviewed by me and considered in my medical decision making (see chart for details). If possible, patient and/or family made aware of any abnormal findings.  No results found. ____________________________________________    PROCEDURES  Procedure(s) performed: None  Procedures  Critical Care performed: None  ____________________________________________   INITIAL IMPRESSION / ASSESSMENT AND PLAN / ED COURSE  Pertinent labs & imaging results that were available during my care of the patient were reviewed by me and considered in my medical decision making (see chart for details).  Well-appearing here largely for a work note.  He is in no acute distress.  I do not think he meets indications for IV fluid after being sick for less than 10 hours, he is young and healthy, we will discharge him with antiemetics and a work note return precautions and follow-up given and understood.  Patient tolerating p.o. with no difficulty abdomen benign. Considering the patient's symptoms, medical history, and physical examination today, I have  low suspicion for cholecystitis or biliary pathology, pancreatitis, perforation or bowel obstruction, hernia, intra-abdominal abscess, AAA or dissection, volvulus or intussusception, mesenteric ischemia, ischemic gut, pyelonephritis or appendicitis.    ____________________________________________   FINAL CLINICAL IMPRESSION(S) / ED DIAGNOSES  Final diagnoses:  None      This chart was dictated using voice recognition software.  Despite best efforts to proofread,  errors can occur which can change meaning.      Schuyler Amor, MD 07/03/17 [phone]

## 2017-07-03 NOTE — Discharge Instructions (Signed)
Return to the emergency room for any new or worrisome symptoms including increased pain, vomiting, fever, pain in one part of your abdomen, bleeding or if you feel worse in any way.  Take plenty of fluids.

## 2017-07-03 NOTE — ED Triage Notes (Signed)
First Nurse Note:  C/O N/V/D.  Onset today.  Patient is AAOx3.  Skin warm and dry. NAD

## 2017-07-03 NOTE — ED Triage Notes (Signed)
Pt presents with n/v/d since 0300. States "a virus has been going around my mom's work and she brought it home." He states that he had to call out of work and he has to have a work note to go back to work. Pt alert & oriented with NAD noted.

## 2017-08-29 ENCOUNTER — Emergency Department
Admission: EM | Admit: 2017-08-29 | Discharge: 2017-08-29 | Disposition: A | Discharge status: Home / Self Care | Payer: Self-pay | Attending: Emergency Medicine | Admitting: Emergency Medicine

## 2017-08-29 ENCOUNTER — Encounter: Payer: Self-pay | Admitting: Emergency Medicine

## 2017-08-29 ENCOUNTER — Other Ambulatory Visit: Payer: Self-pay

## 2017-08-29 DIAGNOSIS — K029 Dental caries, unspecified: Secondary | ICD-10-CM | POA: Insufficient documentation

## 2017-08-29 DIAGNOSIS — Z79899 Other long term (current) drug therapy: Secondary | ICD-10-CM | POA: Insufficient documentation

## 2017-08-29 DIAGNOSIS — F17290 Nicotine dependence, other tobacco product, uncomplicated: Secondary | ICD-10-CM | POA: Insufficient documentation

## 2017-08-29 MED ORDER — TRAMADOL HCL 50 MG PO TABS: 50.0000 mg | ORAL_TABLET | Freq: Four times a day (QID) | ORAL | 0 refills | Status: DC | PRN

## 2017-08-29 MED ORDER — AMOXICILLIN 500 MG PO CAPS: 500.0000 mg | ORAL_CAPSULE | Freq: Three times a day (TID) | ORAL | 0 refills | Status: DC

## 2017-08-29 NOTE — ED Provider Notes (Signed)
[facility] Emergency Department Provider Note   ____________________________________________   First MD Initiated Contact with Patient 08/29/17 1053     (approximate)  I have reviewed the triage vital signs and the nursing notes.   HISTORY  Chief Complaint Dental Pain    HPI Charles Rodriguez is a 27 y.o. male patient complain of dental pain to the left upper lower molar area.  This is a chronic complaint patient seen every year at this facility for same complaint.  Patient does not follow with dentist.  Patient denies fever or swelling.  Patient rates pain as a 9/10.  Patient described the pain is "achy".  No palliative measure for complaint.  History reviewed. No pertinent past medical history.  There are no active problems to display for this patient.   Past Surgical History:  Procedure Laterality Date  . NECK SURGERY      Prior to Admission medications   Medication Sig Start Date End Date Taking? Authorizing Provider  acetaminophen (TYLENOL) 500 MG tablet Take 1 tablet (500 mg total) by mouth every 6 (six) hours as needed. 04/06/17   Laban Emperor, PA-C  amoxicillin (AMOXIL) 500 MG capsule Take 1 capsule (500 mg total) by mouth 3 (three) times daily. 08/29/17   Sable Feil, PA-C  benzonatate (TESSALON PERLES) 100 MG capsule Take 1 capsule (100 mg total) by mouth 3 (three) times daily as needed for cough. 04/06/17 04/06/18  Laban Emperor, PA-C  cyclobenzaprine (FLEXERIL) 10 MG tablet Take 1 tablet (10 mg total) by mouth 3 (three) times daily as needed for muscle spasms. 01/02/16   Harvest Dark, MD  fluticasone (FLONASE) 50 MCG/ACT nasal spray Place 2 sprays into both nostrils daily. 04/06/17 04/06/18  Laban Emperor, PA-C  ibuprofen (ADVIL,MOTRIN) 600 MG tablet Take 1 tablet (600 mg total) by mouth every 6 (six) hours as needed. 04/06/17   Laban Emperor, PA-C  lidocaine (XYLOCAINE) 2 % solution Use as directed 10 mLs in the mouth or throat as needed  for mouth pain. 04/06/17   Laban Emperor, PA-C  magic mouthwash SOLN Take 5 mLs by mouth 4 (four) times daily as needed for mouth pain. 03/28/16   Loney Hering, MD  ondansetron (ZOFRAN) 4 MG tablet Take 1 tablet (4 mg total) by mouth every 8 (eight) hours as needed for nausea or vomiting. 07/03/17   Schuyler Amor, MD  traMADol (ULTRAM) 50 MG tablet Take 1 tablet (50 mg total) by mouth every 6 (six) hours as needed for moderate pain. 09/11/16   Sable Feil, PA-C  traMADol (ULTRAM) 50 MG tablet Take 1 tablet (50 mg total) by mouth every 6 (six) hours as needed. 08/29/17   Sable Feil, PA-C    Allergies Patient has no known allergies.  History reviewed. No pertinent family history.  Social History Social History   Tobacco Use  . Smoking status: Current Every Day Smoker    Packs/day: 1.00    Types: Cigars  . Smokeless tobacco: Never Used  Substance Use Topics  . Alcohol use: Yes    Comment: occasional  . Drug use: No    Review of Systems Constitutional: No fever/chills ENT: No sore throat.  Dental pain. Cardiovascular: Denies chest pain. Respiratory: Denies shortness of breath. Skin: Negative for rash. Neurological: Negative for headaches, focal weakness or numbness.   ____________________________________________   PHYSICAL EXAM:  VITAL SIGNS: ED Triage Vitals  Enc Vitals Group     BP 08/29/17 1110 137/68  Pulse Rate 08/29/17 1110 (!) 58     Resp 08/29/17 1110 18     Temp 08/29/17 1110 98.5 F (36.9 C)     Temp Source 08/29/17 1110 Oral     SpO2 08/29/17 1110 100 %     Weight 08/29/17 1111 170 lb (77.1 kg)     Height 08/29/17 1111 5\' 8"  (1.727 m)     Head Circumference --      Peak Flow --      Pain Score 08/29/17 1111 9     Pain Loc --      Pain Edu? --      Excl. in [place]? --     Constitutional: Alert and oriented. Well appearing and in no acute distress. Mouth/Throat: Mucous membranes are moist.  Oropharynx non-erythematous.  Caries located at  tooth #2, 4, 18, and 19. Neck: No stridor.  Hematological/Lymphatic/Immunilogical: No cervical lymphadenopathy. Cardiovascular: Normal rate, regular rhythm. Grossly normal heart sounds.  Good peripheral circulation. Respiratory: Normal respiratory effort.  No retractions. Lungs CTAB. Skin:  Skin is warm, dry and intact. No rash noted. Psychiatric: Mood and affect are normal. Speech and behavior are normal.  ____________________________________________   LABS (all labs ordered are listed, but only abnormal results are displayed)  Labs Reviewed - No data to display ____________________________________________  EKG   ____________________________________________  RADIOLOGY  ED MD interpretation:    Official radiology report(s): No results found.  ____________________________________________   PROCEDURES  Procedure(s) performed:   Procedures  Critical Care performed:   ____________________________________________   INITIAL IMPRESSION / ASSESSMENT AND PLAN / ED COURSE  As part of my medical decision making, I reviewed the following data within the [place] pain secondary to devitalized teeth.  Advised patient must see dentist for definitive evaluation and treatment.  Patient given discharge care instruction advised take medication as directed.      ____________________________________________   FINAL CLINICAL IMPRESSION(S) / ED DIAGNOSES  Final diagnoses:  Pain due to dental caries     ED Discharge Orders        Ordered    amoxicillin (AMOXIL) 500 MG capsule  3 times daily     08/29/17 1158    traMADol (ULTRAM) 50 MG tablet  Every 6 hours PRN     08/29/17 1158       Note:  This document was prepared using Dragon voice recognition software and may include unintentional dictation errors.    Sable Feil, PA-C 08/29/17 1203    Schuyler Amor, MD 08/29/17 [phone]

## 2017-08-29 NOTE — ED Triage Notes (Signed)
Pt c/o L sided dental pain. Pt states known dental carries to that side.

## 2017-08-29 NOTE — Discharge Instructions (Signed)
Follow-up from list of dental clinics provided. OPTIONS FOR DENTAL FOLLOW UP CARE  [place] [facility] and [place] [email or web address] [phone])  [facility] [phone])  [place] [phone])  [place] [phone])  [facility] [phone]) This clinic caters to the indigent population and is on a lottery system. Location: [employer] [facility], [place], [place], [place] [facility] Hours: Wednesdays from 6pm - 9pm, patients seen by a lottery system. For dates, call or go to [email or web address] Services: Cleanings, fillings and simple extractions. Payment Options: DENTAL WORK IS FREE OF CHARGE. Bring proof of income or support. Best way to get seen: Arrive at 5:15 pm - this is a lottery, NOT first come/first serve, so arriving earlier will not increase your chances of being seen.     [facility] [phone] Select option 1 for emergencies   Location: [facility], [place], [address], [place] Clinic Hours: No walk-ins accepted - call the day before to schedule an appointment. Check in times are 9:30 am and 1:30 pm. Services: Simple extractions, temporary fillings, pulpectomy/pulp debridement, uncomplicated abscess drainage. Payment Options: PAYMENT IS DUE AT THE TIME OF SERVICE.  Fee is usually $100-200, additional surgical procedures (e.g. abscess drainage) may be extra. Cash, checks, Visa/MasterCard accepted.  Can file Medicaid if patient is covered for dental - patient should call case worker to check. No discount for [facility] patients. Best way to get seen: MUST call the day before and get onto the schedule. Can usually be seen the next 1-2 days. No walk-ins accepted.      [place] [phone]   Location: [place], [place] [facility] Hours: M, W, Th, F 8am or 1:30pm, Tues 9a or 1:30 - first come/first served. Services: Simple extractions, temporary fillings, uncomplicated abscess drainage.  You do not need to be an [facility] resident. Payment Options: PAYMENT IS DUE AT THE TIME OF SERVICE. Dental insurance, otherwise sliding scale - bring proof of income or support. Depending on income and treatment needed, cost is usually $50-200. Best way to get seen: Arrive early as it is first come/first served.     [facility] [phone]   Location: [place] [facility] Hours: Mon-Thu 8a-5p Services: Most basic dental services including extractions and fillings. Payment Options: PAYMENT IS DUE AT THE TIME OF SERVICE. Sliding scale, up to 50% off - bring proof if income or support. Medicaid with dental option accepted. Best way to get seen: Call to schedule an appointment, can usually be seen within 2 weeks OR they will try to see walk-ins - show up at [place] or 2p (you may have to wait).     [facility] RESIDENTS ONLY   Location: [facility], [place] [address] [place], [place] [address] Clinic Hours: By appointment only. Monday - Thursday 8am-5pm, Friday 8am-12pm Services: Cleanings, fillings, extractions. Payment Options: PAYMENT IS DUE AT THE TIME OF SERVICE. Cash, Visa or MasterCard. Sliding scale - $30 minimum per service. Best way to get seen: Come in to office, complete packet and make an appointment - need proof of income or support monies for each household member and proof of [facility] residence. Usually takes about a month to get in.     [facility]  [phone]   Location: [place] [facility] Hours: Walk-in Urgent Care  Dental Services are offered Monday-Friday mornings only. The numbers of emergencies accepted daily is limited to the number of providers available. Maximum 15 - Mondays, Wednesdays & Thursdays Maximum 10 - Tuesdays & Fridays Services: You do not need to be a [facility] resident to be seen for a dental emergency. Emergencies are defined as pain, swelling, abnormal bleeding, or dental trauma. Walkins will receive x-rays if needed. NOTE: Dental cleaning is not an emergency. Payment Options: PAYMENT IS DUE AT THE TIME OF SERVICE. Minimum co-pay is $40.00 for uninsured patients. Minimum co-pay is $3.00 for Medicaid with dental coverage. Dental Insurance is accepted and must be presented at time of visit. Medicare does not cover dental. Forms of payment: Cash, credit card, checks. Best way to get seen: If not previously registered with the clinic, walk-in dental registration begins at 7:15 am and is on a first come/first serve basis. If previously registered with the clinic, call to make an appointment.     [facility] [place] ONLY   Location: [address] [place], [place] Clinic Hours: Mon-Thu 10a-2p Services: Extractions only! Payment Options: FREE (donations accepted) - bring proof of income or support Best way to get seen: Call and schedule an appointment OR come at 8am on the 1st Monday of every month (except for holidays) when it is first come/first served.     [facility] [phone]   Location: [place], [place] [facility] Hours: Friday mornings Services, Payment Options, Best way to get seen: Call for info

## 2017-11-07 ENCOUNTER — Emergency Department
Admission: EM | Admit: 2017-11-07 | Discharge: 2017-11-07 | Disposition: A | Discharge status: Home / Self Care | Payer: Self-pay | Attending: Emergency Medicine | Admitting: Emergency Medicine

## 2017-11-07 ENCOUNTER — Emergency Department: Payer: Self-pay

## 2017-11-07 ENCOUNTER — Other Ambulatory Visit: Payer: Self-pay

## 2017-11-07 DIAGNOSIS — R1310 Dysphagia, unspecified: Secondary | ICD-10-CM

## 2017-11-07 DIAGNOSIS — Z79899 Other long term (current) drug therapy: Secondary | ICD-10-CM | POA: Insufficient documentation

## 2017-11-07 DIAGNOSIS — F17290 Nicotine dependence, other tobacco product, uncomplicated: Secondary | ICD-10-CM | POA: Insufficient documentation

## 2017-11-07 DIAGNOSIS — D479 Neoplasm of uncertain behavior of lymphoid, hematopoietic and related tissue, unspecified: Secondary | ICD-10-CM

## 2017-11-07 NOTE — ED Triage Notes (Signed)
Left sided neck pain, left ear pain, left neck swelling X 2 days. States hx of same. Pt states pain with swallowing. Pt alert and oriented X4, active, cooperative, pt in NAD. RR even and unlabored, color WNL.

## 2017-11-07 NOTE — Discharge Instructions (Addendum)
Advised to follow-up with [facility] or you and see ENT clinic for definitive evaluation and treatment.

## 2017-11-07 NOTE — ED Provider Notes (Signed)
[facility] Emergency Department Provider Note   ____________________________________________   None    (approximate)  I have reviewed the triage vital signs and the nursing notes.   HISTORY  Chief Complaint Neck Pain and Otalgia    HPI Charles Rodriguez is a 27 y.o. male is complaining of left lateral ear and neck pain.  Patient state noticed swelling to the left lateral neck 2 days ago.  Patient also complained of difficulty with swallowing.  Patient denies sore throat.  Patient denies URI side system.  Patient state he had a mass removed from the left lateral inferior neck in 2012.  Patient did not know the diagnosis of the neck mass that was excised.  Patient rates his pain as a 10/10.  Patient described the pain is "aching".  No palliative measures for complaint.   History reviewed. No pertinent past medical history.  There are no active problems to display for this patient.   Past Surgical History:  Procedure Laterality Date  . NECK SURGERY      Prior to Admission medications   Medication Sig Start Date End Date Taking? Authorizing Provider  acetaminophen (TYLENOL) 500 MG tablet Take 1 tablet (500 mg total) by mouth every 6 (six) hours as needed. 04/06/17   Laban Emperor, PA-C  amoxicillin (AMOXIL) 500 MG capsule Take 1 capsule (500 mg total) by mouth 3 (three) times daily. 08/29/17   Sable Feil, PA-C  benzonatate (TESSALON PERLES) 100 MG capsule Take 1 capsule (100 mg total) by mouth 3 (three) times daily as needed for cough. 04/06/17 04/06/18  Laban Emperor, PA-C  cyclobenzaprine (FLEXERIL) 10 MG tablet Take 1 tablet (10 mg total) by mouth 3 (three) times daily as needed for muscle spasms. 01/02/16   Harvest Dark, MD  fluticasone (FLONASE) 50 MCG/ACT nasal spray Place 2 sprays into both nostrils daily. 04/06/17 04/06/18  Laban Emperor, PA-C  ibuprofen (ADVIL,MOTRIN) 600 MG tablet Take 1 tablet (600 mg total) by mouth every 6 (six) hours as  needed. 04/06/17   Laban Emperor, PA-C  lidocaine (XYLOCAINE) 2 % solution Use as directed 10 mLs in the mouth or throat as needed for mouth pain. 04/06/17   Laban Emperor, PA-C  magic mouthwash SOLN Take 5 mLs by mouth 4 (four) times daily as needed for mouth pain. 03/28/16   Loney Hering, MD  ondansetron (ZOFRAN) 4 MG tablet Take 1 tablet (4 mg total) by mouth every 8 (eight) hours as needed for nausea or vomiting. 07/03/17   Schuyler Amor, MD  traMADol (ULTRAM) 50 MG tablet Take 1 tablet (50 mg total) by mouth every 6 (six) hours as needed for moderate pain. 09/11/16   Sable Feil, PA-C  traMADol (ULTRAM) 50 MG tablet Take 1 tablet (50 mg total) by mouth every 6 (six) hours as needed. 08/29/17   Sable Feil, PA-C    Allergies Patient has no known allergies.  No family history on file.  Social History Social History   Tobacco Use  . Smoking status: Current Every Day Smoker    Packs/day: 1.00    Types: Cigars  . Smokeless tobacco: Never Used  Substance Use Topics  . Alcohol use: Yes    Comment: occasional  . Drug use: No    Review of Systems Constitutional: No fever/chills Eyes: No visual changes. ENT: Difficulty with swallowing.  Left ear pain.  Left lateral neck edema.  Cardiovascular: Denies chest pain. Respiratory: Denies shortness of breath. Gastrointestinal: No abdominal  pain.  No nausea, no vomiting.  No diarrhea.  No constipation. Genitourinary: Negative for dysuria. Musculoskeletal: Negative for back pain. Skin: Negative for rash. Neurological: Negative for headaches, focal weakness or numbness.   ____________________________________________   PHYSICAL EXAM:  VITAL SIGNS: ED Triage Vitals  Enc Vitals Group     BP 11/07/17 1429 127/71     Pulse Rate 11/07/17 1429 61     Resp 11/07/17 1429 18     Temp 11/07/17 1429 99.2 F (37.3 C)     Temp Source 11/07/17 1429 Oral     SpO2 11/07/17 1429 99 %     Weight 11/07/17 1429 175 lb (79.4 kg)      Height 11/07/17 1429 5\' 8"  (1.727 m)     Head Circumference --      Peak Flow --      Pain Score 11/07/17 1433 10     Pain Loc --      Pain Edu? --      Excl. in [place]? --    Constitutional: Alert and oriented. Well appearing and in no acute distress. Mouth/Throat: Mucous membranes are moist.  Oropharynx non-erythematous. Neck: No stridor. No cervical spine tenderness to palpation. Hematological/Lymphatic/Immunilogical: Left cervical lymphadenopathy. Cardiovascular: Normal rate, regular rhythm. Grossly normal heart sounds.  Good peripheral circulation. Respiratory: Normal respiratory effort.  No retractions. Lungs CTAB. Musculoskeletal: No lower extremity tenderness nor edema.  No joint effusions. Neurologic:  Normal speech and language. No gross focal neurologic deficits are appreciated. No gait instability. Skin:  Skin is warm, dry and intact. No rash noted. Psychiatric: Mood and affect are normal. Speech and behavior are normal.  ____________________________________________   LABS (all labs ordered are listed, but only abnormal results are displayed)  Labs Reviewed - No data to display ____________________________________________  EKG   ____________________________________________  RADIOLOGY  ED MD interpretation:    Official radiology report(s): US Soft Tissue Head & Neck (non-thyroid)  Result Date: 11/07/2017 CLINICAL DATA:  27 year old male with a history of dysphagia and left neck pain EXAM: ULTRASOUND OF HEAD/NECK SOFT TISSUES TECHNIQUE: Ultrasound examination of the head and neck soft tissues was performed in the area of clinical concern. COMPARISON:  None. FINDINGS: Grayscale and color duplex performed in the region of clinical concern. Enlarged lymph nodes in the left neck in the region of the patient's symptoms. IMPRESSION: Left neck lymphadenopathy. If there is concern for lymphoproliferative disorder recommend correlation with lab values and patient presentation.  Electronically Signed   By: Corrie Mckusick D.O.   On: 11/07/2017 15:47    ____________________________________________   PROCEDURES  Procedure(s) performed: None  Procedures  Critical Care performed: No  ____________________________________________   INITIAL IMPRESSION / ASSESSMENT AND PLAN / ED COURSE  As part of my medical decision making, I reviewed the following data within the [place]    Left lymphadenopathy and dysphagia.  Discussed ultrasound findings with patient.  Patient will follow-up with [facility] for definitive evaluation and treatment.     ____________________________________________   FINAL CLINICAL IMPRESSION(S) / ED DIAGNOSES  Final diagnoses:  Dysphagia  Lymphoproliferative disease [facility])     ED Discharge Orders    None       Note:  This document was prepared using Dragon voice recognition software and may include unintentional dictation errors.    Sable Feil, PA-C 11/07/17 1621    Harvest Dark, MD 11/07/17 2252

## 2017-11-07 NOTE — ED Notes (Signed)
Pt ambulatory to POV without difficulty. VSS, NAD. Discharge instructions, and follow up reviewed. All questions addressed.

## 2018-02-26 ENCOUNTER — Emergency Department: Payer: Self-pay

## 2018-02-26 ENCOUNTER — Emergency Department
Admission: EM | Admit: 2018-02-26 | Discharge: 2018-02-26 | Disposition: A | Discharge status: Home / Self Care | Payer: Self-pay | Attending: Emergency Medicine | Admitting: Emergency Medicine

## 2018-02-26 ENCOUNTER — Encounter: Payer: Self-pay | Admitting: Emergency Medicine

## 2018-02-26 DIAGNOSIS — Z79899 Other long term (current) drug therapy: Secondary | ICD-10-CM | POA: Insufficient documentation

## 2018-02-26 DIAGNOSIS — S025XXB Fracture of tooth (traumatic), initial encounter for open fracture: Secondary | ICD-10-CM | POA: Insufficient documentation

## 2018-02-26 DIAGNOSIS — Y9361 Activity, american tackle football: Secondary | ICD-10-CM | POA: Insufficient documentation

## 2018-02-26 DIAGNOSIS — F17210 Nicotine dependence, cigarettes, uncomplicated: Secondary | ICD-10-CM | POA: Insufficient documentation

## 2018-02-26 DIAGNOSIS — W500XXA Accidental hit or strike by another person, initial encounter: Secondary | ICD-10-CM | POA: Insufficient documentation

## 2018-02-26 DIAGNOSIS — S0451XA Injury of facial nerve, right side, initial encounter: Secondary | ICD-10-CM | POA: Insufficient documentation

## 2018-02-26 DIAGNOSIS — Y999 Unspecified external cause status: Secondary | ICD-10-CM | POA: Insufficient documentation

## 2018-02-26 DIAGNOSIS — Y92838 Other recreation area as the place of occurrence of the external cause: Secondary | ICD-10-CM | POA: Insufficient documentation

## 2018-02-26 MED ORDER — PREDNISONE 10 MG (21) PO TBPK: ORAL_TABLET | ORAL | 0 refills | Status: DC

## 2018-02-26 MED ORDER — TRAMADOL HCL 50 MG PO TABS: 50.0000 mg | ORAL_TABLET | Freq: Four times a day (QID) | ORAL | 0 refills | Status: DC | PRN

## 2018-02-26 NOTE — Discharge Instructions (Signed)
You also have an injury to the submental nerve. Please discuss this with the dentist. While taking prednisone, this may resolve. You will likely develop pain in the broken tooth if the numbness in your mouth/lip resolves. If so, take the tramadol.  Please also rinse your mouth with warm salt water 4 times per day.  Use DenTemp that can be purchased at the pharmacy to cover the open area where the tooth is broken until you see the dentist.  Call tomorrow to schedule an appointment.  Return to the ER for symptoms that change or worsen or for new concerns.

## 2018-02-26 NOTE — ED Notes (Signed)
Pt state he was playing football on Sunday and had a right lower tooth knocked out and today having numbness and swelling to the right lower face/jaw and lower lip.
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
## 2018-05-10 ENCOUNTER — Encounter: Payer: Self-pay | Admitting: Emergency Medicine

## 2018-05-10 ENCOUNTER — Other Ambulatory Visit: Payer: Self-pay

## 2018-05-10 ENCOUNTER — Emergency Department
Admission: EM | Admit: 2018-05-10 | Discharge: 2018-05-10 | Disposition: A | Discharge status: Home / Self Care | Payer: Self-pay | Attending: Emergency Medicine | Admitting: Emergency Medicine

## 2018-05-10 DIAGNOSIS — J111 Influenza due to unidentified influenza virus with other respiratory manifestations: Secondary | ICD-10-CM | POA: Insufficient documentation

## 2018-05-10 DIAGNOSIS — F17290 Nicotine dependence, other tobacco product, uncomplicated: Secondary | ICD-10-CM | POA: Insufficient documentation

## 2018-05-10 LAB — INFLUENZA PANEL BY PCR (TYPE A & B)
Influenza A By PCR: NEGATIVE
Influenza B By PCR: POSITIVE — AB

## 2018-05-10 MED ORDER — PSEUDOEPH-BROMPHEN-DM 30-2-10 MG/5ML PO SYRP: 5.0000 mL | ORAL_SOLUTION | Freq: Four times a day (QID) | ORAL | 0 refills | Status: DC | PRN

## 2018-05-10 MED ORDER — OSELTAMIVIR PHOSPHATE 75 MG PO CAPS: 75.0000 mg | ORAL_CAPSULE | Freq: Two times a day (BID) | ORAL | 0 refills | Status: AC

## 2018-05-10 MED ORDER — ACETAMINOPHEN 500 MG PO TABS: 500.0000 mg | ORAL_TABLET | Freq: Four times a day (QID) | ORAL | 0 refills | Status: DC | PRN

## 2018-05-10 MED ORDER — IBUPROFEN 600 MG PO TABS: 600.0000 mg | ORAL_TABLET | Freq: Four times a day (QID) | ORAL | 0 refills | Status: DC | PRN

## 2018-05-10 NOTE — ED Provider Notes (Signed)
[facility] Emergency Department Provider Note  ____________________________________________  Time seen: Approximately 2:03 PM  I have reviewed the triage vital signs and the nursing notes.   HISTORY  Chief Complaint Fever; Chills; and Generalized Body Aches    HPI Charles Rodriguez is a 28 y.o. male presents emergency department for evaluation of fever, chills, body aches, nasal congestion, sore throat, nonproductive cough since yesterday.  Patient had the flu last year and states this feels the same.  Ellene Route has same symptoms.  No alleviating measures have been attempted.  He needs a note for work.  No shortness of breath, chest pain, vomiting, diarrhea.   History reviewed. No pertinent past medical history.  There are no active problems to display for this patient.   Past Surgical History:  Procedure Laterality Date  . NECK SURGERY      Prior to Admission medications   Medication Sig Start Date End Date Taking? Authorizing Provider  acetaminophen (TYLENOL) 500 MG tablet Take 1 tablet (500 mg total) by mouth every 6 (six) hours as needed. 05/10/18   Laban Emperor, PA-C  amoxicillin (AMOXIL) 500 MG capsule Take 1 capsule (500 mg total) by mouth 3 (three) times daily. 08/29/17   Sable Feil, PA-C  brompheniramine-pseudoephedrine-DM 30-2-10 MG/5ML syrup Take 5 mLs by mouth 4 (four) times daily as needed. 05/10/18   Laban Emperor, PA-C  cyclobenzaprine (FLEXERIL) 10 MG tablet Take 1 tablet (10 mg total) by mouth 3 (three) times daily as needed for muscle spasms. 01/02/16   Harvest Dark, MD  fluticasone (FLONASE) 50 MCG/ACT nasal spray Place 2 sprays into both nostrils daily. 04/06/17 04/06/18  Laban Emperor, PA-C  ibuprofen (ADVIL,MOTRIN) 600 MG tablet Take 1 tablet (600 mg total) by mouth every 6 (six) hours as needed. 05/10/18   Laban Emperor, PA-C  lidocaine (XYLOCAINE) 2 % solution Use as directed 10 mLs in the mouth or throat as needed for mouth pain.  04/06/17   Laban Emperor, PA-C  magic mouthwash SOLN Take 5 mLs by mouth 4 (four) times daily as needed for mouth pain. 03/28/16   Loney Hering, MD  ondansetron (ZOFRAN) 4 MG tablet Take 1 tablet (4 mg total) by mouth every 8 (eight) hours as needed for nausea or vomiting. 07/03/17   Schuyler Amor, MD  oseltamivir (TAMIFLU) 75 MG capsule Take 1 capsule (75 mg total) by mouth 2 (two) times daily for 5 days. 05/10/18 05/15/18  Laban Emperor, PA-C  predniSONE (STERAPRED UNI-PAK 21 TAB) 10 MG (21) TBPK tablet Take 6 tablets on the first day and decrease by 1 tablet each day until finished. 02/26/18   Triplett, Johnette Abraham B, FNP  traMADol (ULTRAM) 50 MG tablet Take 1 tablet (50 mg total) by mouth every 6 (six) hours as needed. 02/26/18   Victorino Dike, FNP    Allergies Patient has no known allergies.  No family history on file.  Social History Social History   Tobacco Use  . Smoking status: Current Every Day Smoker    Packs/day: 1.00    Types: Cigars  . Smokeless tobacco: Never Used  Substance Use Topics  . Alcohol use: Yes    Comment: occasional  . Drug use: No     Review of Systems  Constitutional: Positive for fever. Eyes: No visual changes. No discharge. ENT: Positive for congestion and rhinorrhea. Cardiovascular: No chest pain. Respiratory: Positive for cough. No SOB. Gastrointestinal: No abdominal pain.  No nausea, no vomiting.  No diarrhea.  No constipation.  Musculoskeletal: Positive for body aches. Skin: Negative for rash, abrasions, lacerations, ecchymosis. Neurological: Positive for headaches.   ____________________________________________   PHYSICAL EXAM:  VITAL SIGNS: ED Triage Vitals  Enc Vitals Group     BP 05/10/18 1230 135/78     Pulse Rate 05/10/18 1230 86     Resp 05/10/18 1230 18     Temp 05/10/18 1230 99.9 F (37.7 C)     Temp Source 05/10/18 1230 Oral     SpO2 05/10/18 1230 97 %     Weight 05/10/18 1231 170 lb (77.1 kg)     Height 05/10/18  1231 5\' 8"  (1.727 m)     Head Circumference --      Peak Flow --      Pain Score 05/10/18 1231 9     Pain Loc --      Pain Edu? --      Excl. in [place]? --      Constitutional: Alert and oriented. Well appearing and in no acute distress. Eyes: Conjunctivae are normal. PERRL. EOMI. No discharge. Head: Atraumatic. ENT: No frontal and maxillary sinus tenderness.      Ears: Tympanic membranes pearly gray with good landmarks. No discharge.      Nose: Mild congestion/rhinnorhea.      Mouth/Throat: Mucous membranes are moist. Oropharynx non-erythematous. Tonsils not enlarged. No exudates. Uvula midline. Neck: No stridor.   Hematological/Lymphatic/Immunilogical: No cervical lymphadenopathy. Cardiovascular: Normal rate, regular rhythm.  Good peripheral circulation. Respiratory: Normal respiratory effort without tachypnea or retractions. Lungs CTAB. Good air entry to the bases with no decreased or absent breath sounds. Gastrointestinal: Bowel sounds 4 quadrants. Soft and nontender to palpation. No guarding or rigidity. No palpable masses. No distention. Musculoskeletal: Full range of motion to all extremities. No gross deformities appreciated. Neurologic:  Normal speech and language. No gross focal neurologic deficits are appreciated.  Skin:  Skin is warm, dry and intact. No rash noted. Psychiatric: Mood and affect are normal. Speech and behavior are normal. Patient exhibits appropriate insight and judgement.   ____________________________________________   LABS (all labs ordered are listed, but only abnormal results are displayed)  Labs Reviewed  INFLUENZA PANEL BY PCR (TYPE A & B) - Abnormal; Notable for the following components:      Result Value   Influenza B By PCR POSITIVE (*)    All other components within normal limits   ____________________________________________  EKG   ____________________________________________  RADIOLOGY  No results  found.  ____________________________________________    PROCEDURES  Procedure(s) performed:    Procedures    Medications - No data to display   ____________________________________________   INITIAL IMPRESSION / ASSESSMENT AND PLAN / ED COURSE  Pertinent labs & imaging results that were available during my care of the patient were reviewed by me and considered in my medical decision making (see chart for details).  Review of the [place] CSRS was performed in accordance of the [place] prior to dispensing any controlled drugs.   Patient's diagnosis is consistent with influenza B. Vital signs and exam are reassuring.  Influenza test is positive.  Patient appears well and is staying well hydrated. Patient should alternate tylenol and ibuprofen for fever. Patient feels comfortable going home. Patient will be discharged home with prescriptions for Tamiflu. Patient is to follow up with primary care as needed or otherwise directed. Patient is given ED precautions to return to the ED for any worsening or new symptoms.     ____________________________________________  FINAL CLINICAL IMPRESSION(S) / ED DIAGNOSES  Final diagnoses:  Influenza      NEW MEDICATIONS STARTED DURING THIS VISIT:  ED Discharge Orders         Ordered    oseltamivir (TAMIFLU) 75 MG capsule  2 times daily     05/10/18 1441    brompheniramine-pseudoephedrine-DM 30-2-10 MG/5ML syrup  4 times daily PRN     05/10/18 1441    ibuprofen (ADVIL,MOTRIN) 600 MG tablet  Every 6 hours PRN     05/10/18 1441    acetaminophen (TYLENOL) 500 MG tablet  Every 6 hours PRN     05/10/18 1441              This chart was dictated using voice recognition software/Dragon. Despite best efforts to proofread, errors can occur which can change the meaning. Any change was purely unintentional.    Laban Emperor, PA-C 05/10/18 [place], [place], MD 05/12/18 1655

## 2018-05-10 NOTE — ED Triage Notes (Signed)
Pt arrived via POV with reports of chills, fever, body aches x 2-3 days, pt's SO is also being seen for the same sxs.   Pt did not get a flu shot.

## 2018-05-10 NOTE — ED Notes (Signed)
3 days flu like sx.  chllls cough, sore throat headache.  No distress.

## 2018-10-02 ENCOUNTER — Observation Stay: Payer: Self-pay | Admitting: Anesthesiology

## 2018-10-02 ENCOUNTER — Encounter: Payer: Self-pay | Admitting: Emergency Medicine

## 2018-10-02 ENCOUNTER — Emergency Department: Payer: Self-pay

## 2018-10-02 ENCOUNTER — Observation Stay
Admission: EM | Admit: 2018-10-02 | Discharge: 2018-10-03 | Disposition: A | Discharge status: Home / Self Care | Payer: Self-pay | Attending: Surgery | Admitting: Surgery

## 2018-10-02 ENCOUNTER — Encounter
Admission: EM | Disposition: A | Discharge status: Home / Self Care | Payer: Self-pay | Source: Home / Self Care | Attending: Emergency Medicine

## 2018-10-02 ENCOUNTER — Other Ambulatory Visit: Payer: Self-pay

## 2018-10-02 DIAGNOSIS — K37 Unspecified appendicitis: Secondary | ICD-10-CM | POA: Diagnosis present

## 2018-10-02 DIAGNOSIS — Z79899 Other long term (current) drug therapy: Secondary | ICD-10-CM | POA: Insufficient documentation

## 2018-10-02 DIAGNOSIS — Z791 Long term (current) use of non-steroidal anti-inflammatories (NSAID): Secondary | ICD-10-CM | POA: Insufficient documentation

## 2018-10-02 DIAGNOSIS — K3580 Unspecified acute appendicitis: Principal | ICD-10-CM | POA: Diagnosis present

## 2018-10-02 DIAGNOSIS — Z1159 Encounter for screening for other viral diseases: Secondary | ICD-10-CM | POA: Insufficient documentation

## 2018-10-02 DIAGNOSIS — F17290 Nicotine dependence, other tobacco product, uncomplicated: Secondary | ICD-10-CM | POA: Insufficient documentation

## 2018-10-02 DIAGNOSIS — Z7951 Long term (current) use of inhaled steroids: Secondary | ICD-10-CM | POA: Insufficient documentation

## 2018-10-02 DIAGNOSIS — K381 Appendicular concretions: Secondary | ICD-10-CM | POA: Insufficient documentation

## 2018-10-02 HISTORY — PX: LAPAROSCOPIC APPENDECTOMY: SHX408

## 2018-10-02 LAB — URINE DRUG SCREEN, QUALITATIVE (ARMC ONLY)
Amphetamines, Ur Screen: NOT DETECTED
Barbiturates, Ur Screen: NOT DETECTED
Benzodiazepine, Ur Scrn: NOT DETECTED
Cannabinoid 50 Ng, Ur ~~LOC~~: POSITIVE — AB
Cocaine Metabolite,Ur ~~LOC~~: NOT DETECTED
MDMA (Ecstasy)Ur Screen: NOT DETECTED
Methadone Scn, Ur: NOT DETECTED
Opiate, Ur Screen: POSITIVE — AB
Phencyclidine (PCP) Ur S: NOT DETECTED
Tricyclic, Ur Screen: NOT DETECTED

## 2018-10-02 LAB — COMPREHENSIVE METABOLIC PANEL
ALT: 18 U/L (ref 0–44)
AST: 22 U/L (ref 15–41)
Albumin: 4.8 g/dL (ref 3.5–5.0)
Alkaline Phosphatase: 66 U/L (ref 38–126)
Anion gap: 10 (ref 5–15)
BUN: 11 mg/dL (ref 6–20)
CO2: 23 mmol/L (ref 22–32)
Calcium: 8.9 mg/dL (ref 8.9–10.3)
Chloride: 103 mmol/L (ref 98–111)
Creatinine, Ser: 0.84 mg/dL (ref 0.61–1.24)
GFR calc Af Amer: >60 mL/min (ref >60)
GFR calc non Af Amer: >60 mL/min (ref >60)
Glucose, Bld: 107 mg/dL — ABNORMAL HIGH (ref 70–99)
Potassium: 4 mmol/L (ref 3.5–5.1)
Sodium: 136 mmol/L (ref 135–145)
Total Bilirubin: 0.9 mg/dL (ref 0.3–1.2)
Total Protein: 7.8 g/dL (ref 6.5–8.1)

## 2018-10-02 LAB — CBC WITH DIFFERENTIAL/PLATELET
Abs Immature Granulocytes: 0.02 10*3/uL (ref 0.00–0.07)
Basophils Absolute: 0 10*3/uL (ref 0.0–0.1)
Basophils Relative: 1 %
Eosinophils Absolute: 0.1 10*3/uL (ref 0.0–0.5)
Eosinophils Relative: 1 %
HCT: 44.2 % (ref 39.0–52.0)
Hemoglobin: 14.1 g/dL (ref 13.0–17.0)
Immature Granulocytes: 0 %
Lymphocytes Relative: 17 %
Lymphs Abs: 1.3 10*3/uL (ref 0.7–4.0)
MCH: 27 pg (ref 26.0–34.0)
MCHC: 31.9 g/dL (ref 30.0–36.0)
MCV: 84.5 fL (ref 80.0–100.0)
Monocytes Absolute: 0.6 10*3/uL (ref 0.1–1.0)
Monocytes Relative: 7 %
Neutro Abs: 6.1 10*3/uL (ref 1.7–7.7)
Neutrophils Relative %: 74 %
Platelets: 252 10*3/uL (ref 150–400)
RBC: 5.23 MIL/uL (ref 4.22–5.81)
RDW: 13.2 % (ref 11.5–15.5)
WBC: 8.1 10*3/uL (ref 4.0–10.5)
nRBC: 0 % (ref 0.0–0.2)

## 2018-10-02 LAB — SARS CORONAVIRUS 2 BY RT PCR (HOSPITAL ORDER, PERFORMED IN ~~LOC~~ HOSPITAL LAB): SARS Coronavirus 2: NEGATIVE

## 2018-10-02 LAB — LIPASE, BLOOD: Lipase: 26 U/L (ref 11–51)

## 2018-10-02 SURGERY — APPENDECTOMY, LAPAROSCOPIC
Anesthesia: General | Site: Abdomen

## 2018-10-02 MED ORDER — ONDANSETRON 4 MG PO TBDP: 4.0000 mg | ORAL_TABLET | Freq: Four times a day (QID) | ORAL | Status: DC | PRN

## 2018-10-02 MED ORDER — MIDAZOLAM HCL 2 MG/2ML IJ SOLN
INTRAMUSCULAR | Status: AC
  Filled 2018-10-02: qty 2

## 2018-10-02 MED ORDER — BUPIVACAINE-EPINEPHRINE (PF) 0.25% -1:200000 IJ SOLN
INTRAMUSCULAR | Status: AC
  Filled 2018-10-02: qty 30

## 2018-10-02 MED ORDER — SODIUM CHLORIDE 0.9 % IV SOLN
INTRAVENOUS | Status: DC
  Administered 2018-10-02 – 2018-10-03 (×3): via INTRAVENOUS

## 2018-10-02 MED ORDER — ROCURONIUM BROMIDE 100 MG/10ML IV SOLN
INTRAVENOUS | Status: DC | PRN
  Administered 2018-10-02: 30 mg via INTRAVENOUS
  Administered 2018-10-02: 20 mg via INTRAVENOUS

## 2018-10-02 MED ORDER — ONDANSETRON HCL 4 MG/2ML IJ SOLN
INTRAMUSCULAR | Status: AC
  Filled 2018-10-02: qty 2

## 2018-10-02 MED ORDER — ROCURONIUM BROMIDE 50 MG/5ML IV SOLN
INTRAVENOUS | Status: AC
  Filled 2018-10-02: qty 1

## 2018-10-02 MED ORDER — SUGAMMADEX SODIUM 200 MG/2ML IV SOLN
INTRAVENOUS | Status: DC | PRN
  Administered 2018-10-02: 150 mg via INTRAVENOUS

## 2018-10-02 MED ORDER — PIPERACILLIN-TAZOBACTAM 3.375 G IVPB 30 MIN
3.3750 g | Freq: Three times a day (TID) | INTRAVENOUS | Status: DC
Start: 2018-10-02 — End: 2018-10-02

## 2018-10-02 MED ORDER — ONDANSETRON HCL 4 MG/2ML IJ SOLN: 4.0000 mg | Freq: Four times a day (QID) | INTRAMUSCULAR | Status: DC | PRN

## 2018-10-02 MED ORDER — FAMOTIDINE 20 MG PO TABS
20.0000 mg | ORAL_TABLET | Freq: Once | ORAL | Status: AC
  Administered 2018-10-02: 20 mg via ORAL
  Filled 2018-10-02: qty 1

## 2018-10-02 MED ORDER — LACTATED RINGERS IV SOLN: INTRAVENOUS | Status: DC

## 2018-10-02 MED ORDER — FENTANYL CITRATE (PF) 250 MCG/5ML IJ SOLN
INTRAMUSCULAR | Status: AC
  Filled 2018-10-02: qty 5

## 2018-10-02 MED ORDER — PROCHLORPERAZINE MALEATE 10 MG PO TABS
10.0000 mg | ORAL_TABLET | Freq: Four times a day (QID) | ORAL | Status: DC | PRN
  Filled 2018-10-02: qty 1

## 2018-10-02 MED ORDER — FENTANYL CITRATE (PF) 100 MCG/2ML IJ SOLN: 25.0000 ug | INTRAMUSCULAR | Status: DC | PRN

## 2018-10-02 MED ORDER — DEXAMETHASONE SODIUM PHOSPHATE 10 MG/ML IJ SOLN
INTRAMUSCULAR | Status: DC | PRN
  Administered 2018-10-02: 10 mg via INTRAVENOUS

## 2018-10-02 MED ORDER — SODIUM CHLORIDE 0.9 % IV BOLUS
1000.0000 mL | Freq: Once | INTRAVENOUS | Status: AC
Start: 2018-10-02 — End: 2018-10-02
  Administered 2018-10-02: 1000 mL via INTRAVENOUS

## 2018-10-02 MED ORDER — ACETAMINOPHEN 500 MG PO TABS
1000.0000 mg | ORAL_TABLET | Freq: Four times a day (QID) | ORAL | Status: DC
  Administered 2018-10-02 – 2018-10-03 (×3): 1000 mg via ORAL
  Filled 2018-10-02 (×3): qty 2

## 2018-10-02 MED ORDER — LIDOCAINE HCL (CARDIAC) PF 100 MG/5ML IV SOSY
PREFILLED_SYRINGE | INTRAVENOUS | Status: DC | PRN
  Administered 2018-10-02: 80 mg via INTRAVENOUS

## 2018-10-02 MED ORDER — PROPOFOL 10 MG/ML IV BOLUS
INTRAVENOUS | Status: DC | PRN
  Administered 2018-10-02: 170 mg via INTRAVENOUS

## 2018-10-02 MED ORDER — IOHEXOL 300 MG/ML  SOLN
100.0000 mL | Freq: Once | INTRAMUSCULAR | Status: AC | PRN
  Administered 2018-10-02: 12:00:00 100 mL via INTRAVENOUS

## 2018-10-02 MED ORDER — FENTANYL CITRATE (PF) 100 MCG/2ML IJ SOLN
INTRAMUSCULAR | Status: DC | PRN
  Administered 2018-10-02 (×2): 100 ug via INTRAVENOUS

## 2018-10-02 MED ORDER — PROPOFOL 10 MG/ML IV BOLUS
INTRAVENOUS | Status: AC
  Filled 2018-10-02: qty 20

## 2018-10-02 MED ORDER — FENTANYL CITRATE (PF) 100 MCG/2ML IJ SOLN
50.0000 ug | Freq: Once | INTRAMUSCULAR | Status: AC
  Administered 2018-10-02: 50 ug via INTRAVENOUS

## 2018-10-02 MED ORDER — PROMETHAZINE HCL 25 MG/ML IJ SOLN: 6.2500 mg | INTRAMUSCULAR | Status: DC | PRN

## 2018-10-02 MED ORDER — SUCCINYLCHOLINE CHLORIDE 20 MG/ML IJ SOLN
INTRAMUSCULAR | Status: DC | PRN
  Administered 2018-10-02: 100 mg via INTRAVENOUS

## 2018-10-02 MED ORDER — MIDAZOLAM HCL 2 MG/2ML IJ SOLN
INTRAMUSCULAR | Status: DC | PRN
  Administered 2018-10-02: 2 mg via INTRAVENOUS

## 2018-10-02 MED ORDER — KETOROLAC TROMETHAMINE 30 MG/ML IJ SOLN
30.0000 mg | Freq: Four times a day (QID) | INTRAMUSCULAR | Status: DC
  Administered 2018-10-02 – 2018-10-03 (×2): 30 mg via INTRAVENOUS
  Filled 2018-10-02 (×2): qty 1

## 2018-10-02 MED ORDER — ENOXAPARIN SODIUM 40 MG/0.4ML ~~LOC~~ SOLN: 40.0000 mg | SUBCUTANEOUS | Status: DC

## 2018-10-02 MED ORDER — FENTANYL CITRATE (PF) 100 MCG/2ML IJ SOLN
INTRAMUSCULAR | Status: AC
  Administered 2018-10-02: 50 ug via INTRAVENOUS
  Filled 2018-10-02: qty 2

## 2018-10-02 MED ORDER — OXYCODONE HCL 5 MG PO TABS
5.0000 mg | ORAL_TABLET | ORAL | Status: DC | PRN
  Administered 2018-10-02: 10 mg via ORAL
  Filled 2018-10-02: qty 2

## 2018-10-02 MED ORDER — MORPHINE SULFATE (PF) 4 MG/ML IV SOLN
4.0000 mg | Freq: Once | INTRAVENOUS | Status: AC
  Administered 2018-10-02: 4 mg via INTRAVENOUS
  Filled 2018-10-02: qty 1

## 2018-10-02 MED ORDER — LACTATED RINGERS IV SOLN
INTRAVENOUS | Status: DC
  Administered 2018-10-02: 18:00:00 via INTRAVENOUS

## 2018-10-02 MED ORDER — MORPHINE SULFATE (PF) 2 MG/ML IV SOLN
2.0000 mg | INTRAVENOUS | Status: DC | PRN
  Administered 2018-10-02: 4 mg via INTRAVENOUS
  Filled 2018-10-02: qty 2

## 2018-10-02 MED ORDER — PANTOPRAZOLE SODIUM 40 MG IV SOLR
40.0000 mg | Freq: Every day | INTRAVENOUS | Status: DC
  Administered 2018-10-02: 40 mg via INTRAVENOUS
  Filled 2018-10-02: qty 40

## 2018-10-02 MED ORDER — BUPIVACAINE-EPINEPHRINE 0.25% -1:200000 IJ SOLN
INTRAMUSCULAR | Status: DC | PRN
  Administered 2018-10-02: 30 mL

## 2018-10-02 MED ORDER — SUGAMMADEX SODIUM 200 MG/2ML IV SOLN
INTRAVENOUS | Status: AC
  Filled 2018-10-02: qty 2

## 2018-10-02 MED ORDER — DEXAMETHASONE SODIUM PHOSPHATE 10 MG/ML IJ SOLN
INTRAMUSCULAR | Status: AC
  Filled 2018-10-02: qty 1

## 2018-10-02 MED ORDER — PROCHLORPERAZINE EDISYLATE 10 MG/2ML IJ SOLN
5.0000 mg | Freq: Four times a day (QID) | INTRAMUSCULAR | Status: DC | PRN
  Filled 2018-10-02: qty 2

## 2018-10-02 MED ORDER — ONDANSETRON HCL 4 MG/2ML IJ SOLN
INTRAMUSCULAR | Status: DC | PRN
  Administered 2018-10-02: 4 mg via INTRAVENOUS

## 2018-10-02 MED ORDER — PIPERACILLIN-TAZOBACTAM 3.375 G IVPB
3.3750 g | Freq: Three times a day (TID) | INTRAVENOUS | Status: DC
  Administered 2018-10-02 – 2018-10-03 (×3): 3.375 g via INTRAVENOUS
  Filled 2018-10-02 (×3): qty 50

## 2018-10-02 SURGICAL SUPPLY — 37 items
APPLIER CLIP 5 13 M/L LIGAMAX5 (MISCELLANEOUS)
BLADE CLIPPER SURG (BLADE) ×6 IMPLANT
CANISTER SUCT 1200ML W/VALVE (MISCELLANEOUS) ×3 IMPLANT
CHLORAPREP W/TINT 26 (MISCELLANEOUS) ×3 IMPLANT
CLIP APPLIE 5 13 M/L LIGAMAX5 (MISCELLANEOUS) IMPLANT
COVER WAND RF STERILE (DRAPES) ×3 IMPLANT
CUTTER FLEX LINEAR 45M (STAPLE) ×3 IMPLANT
DERMABOND ADVANCED (GAUZE/BANDAGES/DRESSINGS) ×2
DERMABOND ADVANCED .7 DNX12 (GAUZE/BANDAGES/DRESSINGS) ×1 IMPLANT
ELECT CAUTERY BLADE 6.4 (BLADE) ×3 IMPLANT
ELECT REM PT RETURN 9FT ADLT (ELECTROSURGICAL) ×3
ELECTRODE REM PT RTRN 9FT ADLT (ELECTROSURGICAL) ×1 IMPLANT
GLOVE BIO SURGEON STRL SZ7 (GLOVE) ×3 IMPLANT
GOWN STRL REUS W/ TWL LRG LVL3 (GOWN DISPOSABLE) ×2 IMPLANT
GOWN STRL REUS W/TWL LRG LVL3 (GOWN DISPOSABLE) ×4
IRRIGATION STRYKERFLOW (MISCELLANEOUS) ×1 IMPLANT
IRRIGATOR STRYKERFLOW (MISCELLANEOUS) ×3
IV NS 1000ML (IV SOLUTION) ×2
IV NS 1000ML BAXH (IV SOLUTION) ×1 IMPLANT
NEEDLE HYPO 22GX1.5 SAFETY (NEEDLE) ×3 IMPLANT
NS IRRIG 500ML POUR BTL (IV SOLUTION) ×3 IMPLANT
PACK LAP CHOLECYSTECTOMY (MISCELLANEOUS) ×3 IMPLANT
PENCIL ELECTRO HAND CTR (MISCELLANEOUS) ×3 IMPLANT
POUCH SPECIMEN RETRIEVAL 10MM (ENDOMECHANICALS) ×3 IMPLANT
RELOAD 45 VASCULAR/THIN (ENDOMECHANICALS) IMPLANT
RELOAD STAPLE TA45 3.5 REG BLU (ENDOMECHANICALS) ×3 IMPLANT
SCISSORS METZENBAUM CVD 33 (INSTRUMENTS) IMPLANT
SHEARS HARMONIC ACE PLUS 36CM (ENDOMECHANICALS) ×3 IMPLANT
SLEEVE ENDOPATH XCEL 5M (ENDOMECHANICALS) ×3 IMPLANT
SPONGE LAP 18X18 RF (DISPOSABLE) ×3 IMPLANT
SUT MNCRL AB 4-0 PS2 18 (SUTURE) ×3 IMPLANT
SUT VICRYL 0 AB UR-6 (SUTURE) ×9 IMPLANT
SYR 20CC LL (SYRINGE) ×3 IMPLANT
TRAY FOLEY MTR SLVR 16FR STAT (SET/KITS/TRAYS/PACK) ×3 IMPLANT
TROCAR XCEL BLUNT TIP 100MML (ENDOMECHANICALS) ×3 IMPLANT
TROCAR XCEL NON-BLD 5MMX100MML (ENDOMECHANICALS) ×6 IMPLANT
TUBING EVAC SMOKE HEATED PNEUM (TUBING) ×3 IMPLANT

## 2018-10-02 NOTE — ED Triage Notes (Signed)
Pt presents to ED via POV with c/o mid abdominal pain with diarrhea since approx 0200 this morning. Pt denies urinary symptoms at this time. Pt is alert and oriented, pt appears to be in pain at this time, ambulatory without difficulty at this time.

## 2018-10-02 NOTE — Anesthesia Postprocedure Evaluation (Signed)
Anesthesia Post Note  Patient: Charles Rodriguez  Procedure(s) Performed: APPENDECTOMY LAPAROSCOPIC (N/A Abdomen)  Patient location during evaluation: PACU Anesthesia Type: General Level of consciousness: awake and alert Pain management: pain level controlled Vital Signs Assessment: post-procedure vital signs reviewed and stable Respiratory status: spontaneous breathing, nonlabored ventilation, respiratory function stable and patient connected to nasal cannula oxygen Cardiovascular status: blood pressure returned to baseline and stable Postop Assessment: no apparent nausea or vomiting Anesthetic complications: no     Last Vitals:  Vitals:   10/02/18 2037 10/02/18 2137  BP: (!) 148/83 132/81  Pulse: 70 63  Resp: 20 20  Temp: 36.5 C 36.9 C  SpO2: 100% 100%    Last Pain:  Vitals:   10/02/18 2137  TempSrc: Oral  PainSc:                  [name]

## 2018-10-02 NOTE — Anesthesia Preprocedure Evaluation (Signed)
Anesthesia Evaluation  Patient identified by MRN, date of birth, ID band Patient awake    Reviewed: Allergy & Precautions, H&P , NPO status , Patient's Chart, lab work & pertinent test results, reviewed documented beta blocker date and time   History of Anesthesia Complications Negative for: history of anesthetic complications  Airway Mallampati: II  TM Distance: >3 FB Neck ROM: full    Dental   Pulmonary neg shortness of breath, neg recent URI, Current Smoker,           Cardiovascular Exercise Tolerance: Good negative cardio ROS       Neuro/Psych negative neurological ROS  negative psych ROS   GI/Hepatic negative GI ROS, Neg liver ROS,   Endo/Other  negative endocrine ROS  Renal/GU negative Renal ROS  negative genitourinary   Musculoskeletal   Abdominal   Peds  Hematology negative hematology ROS (+)   Anesthesia Other Findings History reviewed. No pertinent past medical history.   Reproductive/Obstetrics negative OB ROS                             Anesthesia Physical Anesthesia Plan  ASA: I  Anesthesia Plan: General   Post-op Pain Management:    Induction: Intravenous, Rapid sequence and Cricoid pressure planned  PONV Risk Score and Plan: 1 and Dexamethasone, Ondansetron, Midazolam, Promethazine and Treatment may vary due to age or medical condition  Airway Management Planned: Oral ETT  Additional Equipment:   Intra-op Plan:   Post-operative Plan: Extubation in OR  Informed Consent: I have reviewed the patients History and Physical, chart, labs and discussed the procedure including the risks, benefits and alternatives for the proposed anesthesia with the patient or authorized representative who has indicated his/her understanding and acceptance.     Dental Advisory Given  Plan Discussed with: Anesthesiologist, CRNA and Surgeon  Anesthesia Plan Comments:          Anesthesia Quick Evaluation

## 2018-10-02 NOTE — ED Notes (Signed)
PT is in CT

## 2018-10-02 NOTE — ED Provider Notes (Signed)
[facility] Emergency Department Provider Note       Time seen: ----------------------------------------- 10:14 AM on 10/02/2018 -----------------------------------------   I have reviewed the triage vital signs and the nursing notes.  HISTORY   Chief Complaint Abdominal Pain    HPI Charles Rodriguez is a 28 y.o. male with a history of neck surgery who presents to the ED for an abdominal pain with diarrhea since this morning at approximately 2:00.  He denies any other symptoms.  Patient denies a history of this before, drank a bottle of Hennessy this weekend but has not hurt until this morning.  He denies any other complaints.  History reviewed. No pertinent past medical history.  There are no active problems to display for this patient.   Past Surgical History:  Procedure Laterality Date  . NECK SURGERY      Allergies Patient has no known allergies.  Social History Social History   Tobacco Use  . Smoking status: Current Every Day Smoker    Packs/day: 1.00    Types: Cigars  . Smokeless tobacco: Never Used  Substance Use Topics  . Alcohol use: Yes    Comment: occasional  . Drug use: No    Review of Systems Constitutional: Negative for fever. Cardiovascular: Negative for chest pain. Respiratory: Negative for shortness of breath. Gastrointestinal: Positive for abdominal pain, one episode of diarrhea Musculoskeletal: Negative for back pain. Skin: Negative for rash. Neurological: Negative for headaches, focal weakness or numbness.  All systems negative/normal/unremarkable except as stated in the HPI  ____________________________________________   PHYSICAL EXAM:  VITAL SIGNS: ED Triage Vitals [10/02/18 1009]  Enc Vitals Group     BP      Pulse      Resp      Temp      Temp src      SpO2      Weight      Height      Head Circumference      Peak Flow      Pain Score 10     Pain Loc      Pain Edu?      Excl. in [place]?     Constitutional: Alert and oriented. Well appearing and in no distress. Eyes: Conjunctivae are normal. Normal extraocular movements. ENT      Head: Normocephalic and atraumatic.      Nose: No congestion/rhinnorhea.      Mouth/Throat: Mucous membranes are moist.      Neck: No stridor. Cardiovascular: Normal rate, regular rhythm. No murmurs, rubs, or gallops. Respiratory: Normal respiratory effort without tachypnea nor retractions. Breath sounds are clear and equal bilaterally. No wheezes/rales/rhonchi. Gastrointestinal: Soft and nontender. Normal bowel sounds Musculoskeletal: Nontender with normal range of motion in extremities. No lower extremity tenderness nor edema. Neurologic:  Normal speech and language. No gross focal neurologic deficits are appreciated.  Skin:  Skin is warm, dry and intact. No rash noted. Psychiatric: Mood and affect are normal. Speech and behavior are normal.   ____________________________________________  ED COURSE:  As part of my medical decision making, I reviewed the following data within the [place] History obtained from family if available, nursing notes, old chart and ekg, as well as notes from prior ED visits. Patient presented for abdominal pain, we will assess with labs and imaging as indicated at this time.   Procedures  Charles Rodriguez was evaluated in Emergency Department on 10/02/2018 for the symptoms described in the history of present  illness. He was evaluated in the context of the global COVID-19 pandemic, which necessitated consideration that the patient might be at risk for infection with the SARS-CoV-2 virus that causes COVID-19. Institutional protocols and algorithms that pertain to the evaluation of patients at risk for COVID-19 are in a state of rapid change based on information released by regulatory bodies including the CDC and federal and state organizations. These policies and algorithms were followed during the patient's  care in the ED.  ____________________________________________   LABS (pertinent positives/negatives)  Labs Reviewed  COMPREHENSIVE METABOLIC PANEL - Abnormal; Notable for the following components:      Result Value   Glucose, Bld 107 (*)    All other components within normal limits  SARS CORONAVIRUS 2 (HOSPITAL ORDER, [place] [facility])  CBC WITH DIFFERENTIAL/PLATELET  LIPASE, BLOOD  URINE DRUG SCREEN, QUALITATIVE ([place])    RADIOLOGY  CT the abdomen pelvis with contrast IMPRESSION: Positive for Acute Appendicitis. Dilated appendix with a 13 mm appendicolith in the distal appendix. No perforation or abscess. Trace reactive pelvic free fluid.  ____________________________________________   DIFFERENTIAL DIAGNOSIS   Gastritis, GERD, pancreatitis, ileus, appendicitis unlikely  FINAL ASSESSMENT AND PLAN  Acute appendicitis   Plan: The patient had presented for upper abdominal pain with one episode of diarrhea. Patient's labs were normal. Patient's imaging presently revealed dilated appendix with appendicolith.  I have discussed with general surgery for evaluation.   Laurence Aly, MD    Note: This note was generated in part or whole with voice recognition software. Voice recognition is usually quite accurate but there are transcription errors that can and very often do occur. I apologize for any typographical errors that were not detected and corrected.     Earleen Newport, MD 10/02/18 [phone]

## 2018-10-02 NOTE — Transfer of Care (Signed)
Immediate Anesthesia Transfer of Care Note  Patient: Charles Rodriguez  Procedure(s) Performed: APPENDECTOMY LAPAROSCOPIC (N/A Abdomen)  Patient Location: PACU  Anesthesia Type:General  Level of Consciousness: drowsy and patient cooperative  Airway & Oxygen Therapy: Patient Spontanous Breathing and Patient connected to face mask oxygen  Post-op Assessment: Report given to RN and Post -op Vital signs reviewed and stable  Post vital signs: Reviewed and stable  Last Vitals:  Vitals Value Taken Time  BP 130/64 10/02/2018  7:03 PM  Temp 36.4 C 10/02/2018  7:03 PM  Pulse 80 10/02/2018  7:04 PM  Resp 19 10/02/2018  7:04 PM  SpO2 99 % 10/02/2018  7:04 PM  Vitals shown include unvalidated device data.  Last Pain:  Vitals:   10/02/18 1741  TempSrc:   PainSc: 10-Worst pain ever      Patients Stated Pain Goal: 5 (13/68/59 9234)  Complications: No apparent anesthesia complications

## 2018-10-02 NOTE — Anesthesia Post-op Follow-up Note (Signed)
Anesthesia QCDR form completed.        

## 2018-10-02 NOTE — ED Notes (Signed)
OR notified that pt UDS and covid results have come back

## 2018-10-02 NOTE — Op Note (Signed)
laparascopic appendectomy   Charles Rodriguez Date of operation:  10/02/2018  Indications: The patient presented with a history of  abdominal pain. Workup has revealed findings consistent with acute appendicitis.  Pre-operative Diagnosis: Acute appendicitis without mention of peritonitis  Post-operative Diagnosis: Same  Surgeon: Caroleen Hamman, MD, FACS  Anesthesia: General with endotracheal tube  Findings: Acute non perforated appendicitis  Estimated Blood Loss: 5cc         Specimens: appendix         Complications:  none  Procedure Details  The patient was seen again in the preop area. The options of surgery versus observation were reviewed with the patient and/or family. The risks of bleeding, infection, recurrence of symptoms, negative laparoscopy, potential for an open procedure, bowel injury, abscess or infection, were all reviewed as well. The patient was taken to Operating Room, identified as Charles Rodriguez and the procedure verified as laparoscopic appendectomy. A Time Out was held and the above information confirmed.  The patient was placed in the supine position and general anesthesia was induced.  Antibiotic prophylaxis was administered and VT E prophylaxis was in place.   The abdomen was prepped and draped in a sterile fashion. An infraumbilical incision was made. A cutdown technique was used to enter the abdominal cavity. Two vicryl stitches were placed on the fascia and a Hasson trocar inserted. Pneumoperitoneum obtained. Two 5 mm ports were placed under direct visualization.  The appendix was identified and found to be acutely inflamed.  The appendix was carefully dissected. The mesoappendix was divided withHarmonic scalpel. The base of the appendix was dissected out and divided with a standard load Endo GIA.The appendix was placed in a Endo Catch bag and removed via the Hasson port. The right lower quadrant and pelvis was then irrigated with  normal saline which was aspirated.  Inspection  failed to identify any additional bleeding and there were no signs of bowel injury. Again the right lower quadrant was inspected there was no sign of bleeding or bowel injury therefore pneumoperitoneum was released, all ports were removed.  The umbilical fascia was closed with 0 Vicryl interrupted sutures and the skin incisions were approximated with subcuticular 4-0 Monocryl. Dermabond was placed The patient tolerated the procedure well, there were no complications. The sponge lap and needle count were correct at the end of the procedure.  The patient was taken to the recovery room in stable condition to be admitted for continued care.    Caroleen Hamman, MD FACS

## 2018-10-02 NOTE — ED Notes (Signed)
Pt's mother to front desk asking to speak to doctor regarding patient, phone number obtained by this RN. Permission to speak to mother obtained by this RN. Phone number placed on desk and MD made aware.

## 2018-10-02 NOTE — ED Notes (Signed)
Walked pt back to Triage to do vitals. Pt was not happy about his fiance not being able to come back with him. I explained to him that we are not allowing any visitors because of [place]. Pt didn't seem happy about the incident.I walked him back to room 12 and told him he could have a seat on the bed and I placed a gown on the bed stated you can put this gown on. He started taking his shirt off and putting gown on,so I closed door for privacy and finish putting his MR# in the computer.I opened the door, he had his gown on and sat on bed. Stated I don't feel comfortable being in the room by myself with you or anyone else. I really want my fiance here. I stated I understand and I will inform your RN [place] and she will be in to speak to you.

## 2018-10-02 NOTE — Anesthesia Procedure Notes (Signed)
Procedure Name: Intubation Date/Time: 10/02/2018 6:18 PM Performed by: Jonna Clark, CRNA Pre-anesthesia Checklist: Patient identified, Patient being monitored, Timeout performed, Emergency Drugs available and Suction available Patient Re-evaluated:Patient Re-evaluated prior to induction Oxygen Delivery Method: Circle system utilized Preoxygenation: Pre-oxygenation with 100% oxygen Induction Type: IV induction Ventilation: Mask ventilation without difficulty Laryngoscope Size: Mac and 3 Grade View: Grade I Tube type: Oral Tube size: 7.5 mm Number of attempts: 1 Airway Equipment and Method: Stylet Placement Confirmation: ETT inserted through vocal cords under direct vision,  positive ETCO2 and breath sounds checked- equal and bilateral Secured at: 21 cm Tube secured with: Tape Dental Injury: Teeth and Oropharynx as per pre-operative assessment

## 2018-10-02 NOTE — H&P (Signed)
[facility] SURGICAL HISTORY & PHYSICAL (cpt [phone])  HISTORY OF PRESENT ILLNESS (HPI):  28 y.o. male presented to [facility] ED today for abdominal pain. Patient reports the acute onset of sharp severe periumbilical abdominal pain around 2 AM this morning. The pain awoke him from sleep. The pain has been constant since and he has not had any relief. He endorses associated nausea and diarrhea with the pain. No reports of fever, chills, cough, congestion, CP, SOB, or bladder changes. He denied any history of similar pain. No previous abdominal surgeries. He did endorse marijuana use last night but no other illicit drug use. Last ate yesterday evening. Work up in the ED was concerning for acute appendicitis.   General surgery is consulted by emergency medicine physician Dr Jacinta Shoe, MD for evaluation and management of acute appendicitis.   PAST MEDICAL HISTORY (PMH):  History reviewed. No pertinent past medical history.  Reviewed. Otherwise negative.   PAST SURGICAL HISTORY ([place]):  Past Surgical History:  Procedure Laterality Date  . NECK SURGERY      Reviewed. Otherwise negative.   MEDICATIONS:  Prior to Admission medications   Medication Sig Start Date End Date Taking? Authorizing Provider  acetaminophen (TYLENOL) 500 MG tablet Take 1 tablet (500 mg total) by mouth every 6 (six) hours as needed. 05/10/18   Laban Emperor, PA-C  amoxicillin (AMOXIL) 500 MG capsule Take 1 capsule (500 mg total) by mouth 3 (three) times daily. 08/29/17   Sable Feil, PA-C  brompheniramine-pseudoephedrine-DM 30-2-10 MG/5ML syrup Take 5 mLs by mouth 4 (four) times daily as needed. 05/10/18   Laban Emperor, PA-C  cyclobenzaprine (FLEXERIL) 10 MG tablet Take 1 tablet (10 mg total) by mouth 3 (three) times daily as needed for muscle spasms. 01/02/16   Harvest Dark, MD  fluticasone (FLONASE) 50 MCG/ACT nasal spray Place 2 sprays into both nostrils daily. 04/06/17 04/06/18  Laban Emperor, PA-C   ibuprofen (ADVIL,MOTRIN) 600 MG tablet Take 1 tablet (600 mg total) by mouth every 6 (six) hours as needed. 05/10/18   Laban Emperor, PA-C  lidocaine (XYLOCAINE) 2 % solution Use as directed 10 mLs in the mouth or throat as needed for mouth pain. 04/06/17   Laban Emperor, PA-C  magic mouthwash SOLN Take 5 mLs by mouth 4 (four) times daily as needed for mouth pain. 03/28/16   Loney Hering, MD  ondansetron (ZOFRAN) 4 MG tablet Take 1 tablet (4 mg total) by mouth every 8 (eight) hours as needed for nausea or vomiting. 07/03/17   Schuyler Amor, MD     ALLERGIES:  No Known Allergies   SOCIAL HISTORY:  Social History   Socioeconomic History  . Marital status: Married    Spouse name: Not on file  . Number of children: Not on file  . Years of education: Not on file  . Highest education level: Not on file  Occupational History  . Not on file  Social Needs  . Financial resource strain: Not on file  . Food insecurity:    Worry: Not on file    Inability: Not on file  . Transportation needs:    Medical: Not on file    Non-medical: Not on file  Tobacco Use  . Smoking status: Current Every Day Smoker    Packs/day: 1.00    Types: Cigars  . Smokeless tobacco: Never Used  Substance and Sexual Activity  . Alcohol use: Yes    Comment: occasional  . Drug use: No  . Sexual activity:  Not on file  Lifestyle  . Physical activity:    Days per week: Not on file    Minutes per session: Not on file  . Stress: Not on file  Relationships  . Social connections:    Talks on phone: Not on file    Gets together: Not on file    Attends religious service: Not on file    Active member of club or organization: Not on file    Attends meetings of clubs or organizations: Not on file    Relationship status: Not on file  . Intimate partner violence:    Fear of current or ex partner: Not on file    Emotionally abused: Not on file    Physically abused: Not on file    Forced sexual activity: Not on  file  Other Topics Concern  . Not on file  Social History Narrative  . Not on file     FAMILY HISTORY:  No family history on file.  Otherwise negative.   REVIEW OF SYSTEMS:  Review of Systems  Constitutional: Negative for chills and fever.  HENT: Negative for congestion and sore throat.   Respiratory: Negative for cough and shortness of breath.   Cardiovascular: Negative for chest pain and palpitations.  Gastrointestinal: Positive for abdominal pain, diarrhea and nausea. Negative for constipation and vomiting.  Genitourinary: Negative for dysuria and urgency.  All other systems reviewed and are negative.   VITAL SIGNS:  Temp:  [97.7 F (36.5 C)] 97.7 F (36.5 C) (06/03 1052) Pulse Rate:  [58-70] 58 (06/03 1300) Resp:  [16-19] 19 (06/03 1300) BP: (144-148)/(83-93) 148/93 (06/03 1300) SpO2:  [98 %-100 %] 100 % (06/03 1300)             PHYSICAL EXAM:  Physical Exam Constitutional:      General: He is not in acute distress.    Appearance: He is well-developed and normal weight. He is not ill-appearing.  HENT:     Head: Normocephalic and atraumatic.  Eyes:     General: No scleral icterus.    Extraocular Movements: Extraocular movements intact.  Cardiovascular:     Rate and Rhythm: Normal rate and regular rhythm.     Heart sounds: Normal heart sounds. No murmur. No friction rub. No gallop.   Pulmonary:     Effort: Pulmonary effort is normal. No respiratory distress.     Breath sounds: Normal breath sounds. No wheezing or rhonchi.  Abdominal:     General: Abdomen is flat. There is no distension.     Palpations: Abdomen is soft.     Tenderness: There is abdominal tenderness. There is rebound. There is no guarding. Positive signs include Rovsing's sign and McBurney's sign. Negative signs include Murphy's sign.  Genitourinary:    Comments: Deferred Skin:    General: Skin is warm and dry.     Coloration: Skin is not jaundiced or pale.  Neurological:     General: No focal  deficit present.     Mental Status: He is alert and oriented to person, place, and time.  Psychiatric:        Mood and Affect: Mood is anxious.        Behavior: Behavior normal.     INTAKE/OUTPUT:  This shift: No intake/output data recorded.  Last 2 shifts: @IOLAST2SHIFTS @  Labs:  CBC Latest Ref Rng & Units 10/02/2018 07/03/2017 01/01/2016  WBC 4.0 - 10.5 K/uL 8.1 9.7 6.2  Hemoglobin 13.0 - 17.0 g/dL 14.1 14.7 13.8  Hematocrit  39.0 - 52.0 % 44.2 45.2 42.5  Platelets 150 - 400 K/uL 252 218 214   CMP Latest Ref Rng & Units 10/02/2018 07/03/2017 01/01/2016  Glucose 70 - 99 mg/dL 107(H) 88 100(H)  BUN 6 - 20 mg/dL 11 13 12   Creatinine 0.61 - 1.24 mg/dL 0.84 0.90 0.86  Sodium 135 - 145 mmol/L 136 136 138  Potassium 3.5 - 5.1 mmol/L 4.0 4.2 3.8  Chloride 98 - 111 mmol/L 103 103 105  CO2 22 - 32 mmol/L 23 27 28   Calcium 8.9 - 10.3 mg/dL 8.9 8.7(L) 9.0  Total Protein 6.5 - 8.1 g/dL 7.8 7.3 -  Total Bilirubin 0.3 - 1.2 mg/dL 0.9 1.2 -  Alkaline Phos 38 - 126 U/L 66 62 -  AST 15 - 41 U/L 22 30 -  ALT 0 - 44 U/L 18 22 -     Imaging studies:   CT Abdomen/Pelvis (10/02/2018) personally reviewed and radiologist report reviewed:  IMPRESSION: Positive for Acute Appendicitis. Dilated appendix with a 13 mm appendicolith in the distal appendix. No perforation or abscess. Trace reactive pelvic free fluid.   Assessment/Plan: (ICD-10's: K67.80) 28 y.o. male who appears quite tender to even light palpation which is most likely attributable to acute appendicitis without evidence of perforation, complicated by pertinent comorbidities including current tobacco abuse (smoking).    - Admit to general surgery  - NPO + IVF + IV ABx (Zosyn)  - pain control prn; antiemetics prn  - monitor abdominal examination   - Will plan for laparoscopic appendectomy with Dr Dahlia Byes today pending OR/Anesthesia availability   - All risks, benefits, and alternatives to the above procedure(s) were discussed with the patient,  all of his questions were answered to his expressed satisfaction, patient expresses he wishes to proceed, and informed consent was obtained.  - Medical management of any comorbidities   All of the above findings and recommendations were discussed with the patient, and all of his questions were answered to his expressed satisfaction.  -- Edison Simon, PA-C [facility] 10/02/2018, 1:07 PM [phone] M-F: 7am - 4pm

## 2018-10-03 ENCOUNTER — Encounter: Payer: Self-pay | Admitting: Surgery

## 2018-10-03 MED ORDER — OXYCODONE HCL 5 MG PO TABS: 5.0000 mg | ORAL_TABLET | Freq: Four times a day (QID) | ORAL | 0 refills | Status: DC | PRN

## 2018-10-03 NOTE — Discharge Instructions (Signed)
In addition to included general post-operative instructions for laparoscopic appendectomy,  Diet: Resume home diet.   Activity: No heavy lifting >20 pounds (children, pets, laundry, garbage) or strenuous activity until follow-up in 2 weeks, but light activity and walking are encouraged. Do not drive or drink alcohol if taking narcotic pain medications.  Wound care: 2 days after surgery (06/05), you may shower/get incision wet with soapy water and pat dry (do not rub incisions), but no baths or submerging incision underwater until follow-up.   Medications: Resume all home medications. For mild to moderate pain: acetaminophen (Tylenol) or ibuprofen/naproxen (if no kidney disease). Combining Tylenol with alcohol can substantially increase your risk of causing liver disease. Narcotic pain medications, if prescribed, can be used for severe pain, though may cause nausea, constipation, and drowsiness. Do not combine Tylenol and Percocet (or similar) within a 6 hour period as Percocet (and similar) contain(s) Tylenol. If you do not need the narcotic pain medication, you do not need to fill the prescription.  Call office [phone] / [phone]) at any time if any questions, worsening pain, fevers/chills, bleeding, drainage from incision site, or other concerns.

## 2018-10-03 NOTE — Discharge Summary (Addendum)
[facility] SURGICAL DISCHARGE SUMMARY  Patient ID: Charles Rodriguez MRN: [id number] DOB/AGE: 03-Sep-1990 28 y.o.  Admit date: 10/02/2018 Discharge date: 10/03/2018  Discharge Diagnoses Patient Active Problem List   Diagnosis Date Noted  . Acute appendicitis 10/02/2018  . Appendicitis 10/02/2018    Consultants None  Procedures 10/02/2018: Laparoscopic Appendectomy  HPI: 28 y.o. male presented to [facility] ED on 06/03 for abdominal pain. Patient reports the acute onset of sharp severe periumbilical abdominal pain around 2 AM this morning. The pain awoke him from sleep. The pain has been constant since and he has not had any relief. He endorses associated nausea and diarrhea with the pain. No reports of fever, chills, cough, congestion, CP, SOB, or bladder changes. He denied any history of similar pain. No previous abdominal surgeries. He did endorse marijuana use last night but no other illicit drug use. Last ate yesterday evening. Work up in the ED was concerning for acute appendicitis.   Hospital Course: Informed consent was obtained and documented, and patient underwent uneventful laparoscopic appendectomy (Dr Dahlia Byes, 10/02/2018).  Post-operatively, patient's pain improved/resolved and advancement of patient's diet and ambulation were well-tolerated. The remainder of patient's hospital course was essentially unremarkable, and discharge planning was initiated accordingly with patient safely able to be discharged home with appropriate discharge instructions, pain control, and outpatient follow-up after all of his and family's (wife via telephone) questions were answered to their expressed satisfaction.   Discharge Condition: Good   Physical Examination:  Constitutional: Well appearing male, NAD Pulmonary: Normal effort, no respiratory distress Gastrointestinal: Soft, incisional soreness, non-distended. No rebound/guarding Skin: Laparoscopic incisions are CDI, no erythema or  drainage   Allergies as of 10/03/2018   No Known Allergies     Medication List    TAKE these medications   acetaminophen 500 MG tablet Commonly known as:  TYLENOL Take 1 tablet (500 mg total) by mouth every 6 (six) hours as needed.   brompheniramine-pseudoephedrine-DM 30-2-10 MG/5ML syrup Take 5 mLs by mouth 4 (four) times daily as needed.   ibuprofen 600 MG tablet Commonly known as:  ADVIL Take 1 tablet (600 mg total) by mouth every 6 (six) hours as needed.   oxyCODONE 5 MG immediate release tablet Commonly known as:  Oxy IR/ROXICODONE Take 1 tablet (5 mg total) by mouth every 6 (six) hours as needed for severe pain or breakthrough pain.        Follow-up Information    Pabon, [place] F, MD. Schedule an appointment as soon as possible for a visit in 2 week(s).   Specialty:  General Surgery Why:  s/p lap appy Contact information: [address] [place] [address] [phone]            Time spent on discharge management including discussion of hospital course, clinical condition, outpatient instructions, prescriptions, and follow up with the patient and members of the medical team: >30 minutes  -- Edison Simon , PA-C [facility]  10/03/2018, 8:09 AM [phone] M-F: 7am - 4pm  I saw and evaluated the patient.  I agree with the above documentation, exam, and plan, which I have edited where appropriate. Fredirick Maudlin  11:51 AM

## 2018-10-03 NOTE — Progress Notes (Signed)
Discharge instructions reviewed with patient including followup visits and new medications.  Understanding was verbalized and all questions were answered.  IV removed without complication; patient tolerated well.  Patient discharged home via wheelchair in stable condition escorted by nursing staff.  

## 2018-10-03 NOTE — Care Management (Signed)
Patient was discharged prior to assessment.  Patient is uninsured and has no PCP.  Per chart review only new medication for discharge was pain medication

## 2018-10-04 LAB — HIV ANTIBODY (ROUTINE TESTING W REFLEX): HIV Screen 4th Generation wRfx: NONREACTIVE

## 2018-10-11 LAB — SURGICAL PATHOLOGY

## 2018-10-16 ENCOUNTER — Telehealth: Payer: Self-pay | Admitting: *Deleted

## 2018-10-16 NOTE — Telephone Encounter (Signed)
Patient would like to return to work full duty on 10/18/18. Work note to be picked up by patient.

## 2018-10-16 NOTE — Telephone Encounter (Signed)
Patient called and needs a back to work note

## 2018-10-21 ENCOUNTER — Encounter: Payer: Self-pay | Admitting: Surgery

## 2018-11-04 ENCOUNTER — Encounter: Payer: Self-pay | Admitting: Surgery

## 2018-11-04 ENCOUNTER — Other Ambulatory Visit: Payer: Self-pay

## 2018-11-04 ENCOUNTER — Telehealth (INDEPENDENT_AMBULATORY_CARE_PROVIDER_SITE_OTHER): Payer: Self-pay | Admitting: Surgery

## 2018-11-04 DIAGNOSIS — Z09 Encounter for follow-up examination after completed treatment for conditions other than malignant neoplasm: Secondary | ICD-10-CM

## 2018-11-04 NOTE — Progress Notes (Signed)
Called pt over the phone S/p lap appy 6/3 Doing very well No complaints Taken PO Path d/w pt in detail Went back to work to [employer] duty  A/p Doing well He is ok following on a prn basis No apparent complications No heavy lifting

## 2019-01-22 ENCOUNTER — Encounter: Payer: Self-pay | Admitting: Emergency Medicine

## 2019-01-22 ENCOUNTER — Other Ambulatory Visit: Payer: Self-pay

## 2019-01-22 ENCOUNTER — Emergency Department
Admission: EM | Admit: 2019-01-22 | Discharge: 2019-01-22 | Discharge status: Against Medical Advice | Payer: Self-pay | Attending: Emergency Medicine | Admitting: Emergency Medicine

## 2019-01-22 DIAGNOSIS — Y999 Unspecified external cause status: Secondary | ICD-10-CM | POA: Insufficient documentation

## 2019-01-22 DIAGNOSIS — Y929 Unspecified place or not applicable: Secondary | ICD-10-CM | POA: Insufficient documentation

## 2019-01-22 DIAGNOSIS — W25XXXA Contact with sharp glass, initial encounter: Secondary | ICD-10-CM | POA: Insufficient documentation

## 2019-01-22 DIAGNOSIS — Z5321 Procedure and treatment not carried out due to patient leaving prior to being seen by health care provider: Secondary | ICD-10-CM | POA: Insufficient documentation

## 2019-01-22 DIAGNOSIS — Y939 Activity, unspecified: Secondary | ICD-10-CM | POA: Insufficient documentation

## 2019-01-22 NOTE — ED Notes (Addendum)
Pt arm cleaned with water of dry blood and debris

## 2019-01-22 NOTE — ED Triage Notes (Signed)
Patient ambulatory to triage with steady gait, without difficulty or distress noted, mask in place; pt reports cutting his rt arm/hand after slamming a glass door; no lacerations noted; dried blood to arm with superficial abrasion to rt upper arm

## 2019-03-02 ENCOUNTER — Other Ambulatory Visit: Payer: Self-pay

## 2019-03-02 ENCOUNTER — Encounter (HOSPITAL_COMMUNITY): Payer: Self-pay

## 2019-03-02 ENCOUNTER — Emergency Department (HOSPITAL_COMMUNITY)
Admission: EM | Admit: 2019-03-02 | Discharge: 2019-03-02 | Disposition: A | Discharge status: Home / Self Care | Payer: Self-pay | Attending: Emergency Medicine | Admitting: Emergency Medicine

## 2019-03-02 ENCOUNTER — Emergency Department (HOSPITAL_COMMUNITY): Payer: Self-pay

## 2019-03-02 DIAGNOSIS — W2181XA Striking against or struck by football helmet, initial encounter: Secondary | ICD-10-CM | POA: Insufficient documentation

## 2019-03-02 DIAGNOSIS — Y92321 Football field as the place of occurrence of the external cause: Secondary | ICD-10-CM | POA: Insufficient documentation

## 2019-03-02 DIAGNOSIS — S8012XA Contusion of left lower leg, initial encounter: Secondary | ICD-10-CM | POA: Insufficient documentation

## 2019-03-02 DIAGNOSIS — Y9361 Activity, american tackle football: Secondary | ICD-10-CM | POA: Insufficient documentation

## 2019-03-02 DIAGNOSIS — Z79899 Other long term (current) drug therapy: Secondary | ICD-10-CM | POA: Insufficient documentation

## 2019-03-02 DIAGNOSIS — Y999 Unspecified external cause status: Secondary | ICD-10-CM | POA: Insufficient documentation

## 2019-03-02 MED ORDER — IBUPROFEN 400 MG PO TABS
600.0000 mg | ORAL_TABLET | Freq: Once | ORAL | Status: AC
  Administered 2019-03-02: 600 mg via ORAL
  Filled 2019-03-02: qty 1

## 2019-03-02 MED ORDER — NAPROXEN 500 MG PO TABS: 500.0000 mg | ORAL_TABLET | Freq: Two times a day (BID) | ORAL | 0 refills | Status: DC

## 2019-03-02 NOTE — ED Notes (Signed)
Patient verbalizes understanding of discharge instructions. Opportunity for questioning and answers were provided. Armband removed by staff, pt discharged from ED.  

## 2019-03-02 NOTE — ED Triage Notes (Signed)
Pt was playing foortballl yesterday and got hit in left chin with helment. No ice was used. Pain 8/10 no meds utilized.

## 2019-03-02 NOTE — Discharge Instructions (Signed)
Take the following medications to help with symptoms. Return to the ED if you start to experience worsening symptoms, additional injuries, increased swelling or numbness.

## 2019-03-02 NOTE — ED Provider Notes (Signed)
[place] EMERGENCY DEPARTMENT Provider Note   CSN: [id number] Arrival date & time: 03/02/19  1109     History   Chief Complaint Chief Complaint  Patient presents with  . Leg Pain    left chin    HPI Charles Rodriguez is a 28 y.o. male who presents to ED for evaluation of left shin pain after injury that occurred yesterday.  States that he was playing football when another player hit his left shin with his helmet.  He denies any other injuries.  He has had pain has gradually worsened since injury yesterday.  He has not been taking any over-the-counter medications to help with symptoms.  Denies any prior fracture, dislocation procedure in the area.  No pain prior to injury.  Denies any hip pain or ankle pain.     HPI  History reviewed. No pertinent past medical history.  Patient Active Problem List   Diagnosis Date Noted  . Acute appendicitis 10/02/2018  . Appendicitis 10/02/2018    Past Surgical History:  Procedure Laterality Date  . LAPAROSCOPIC APPENDECTOMY N/A 10/02/2018   Procedure: APPENDECTOMY LAPAROSCOPIC;  Surgeon: Jules Husbands, MD;  Location: [facility] ORS;  Service: General;  Laterality: N/A;  . NECK SURGERY          Home Medications    Prior to Admission medications   Medication Sig Start Date End Date Taking? Authorizing Provider  naproxen (NAPROSYN) 500 MG tablet Take 1 tablet (500 mg total) by mouth 2 (two) times daily. 03/02/19   Delia Heady, PA-C    Family History No family history on file.  Social History Social History   Tobacco Use  . Smoking status: Current Every Day Smoker    Packs/day: 1.00    Types: Cigars  . Smokeless tobacco: Never Used  Substance Use Topics  . Alcohol use: Yes    Comment: occasional  . Drug use: No     Allergies   Patient has no known allergies.   Review of Systems Review of Systems  Constitutional: Negative for fever.  Musculoskeletal: Positive for myalgias. Negative for arthralgias.   Skin: Negative for wound.  Neurological: Negative for weakness and numbness.     Physical Exam Updated Vital Signs BP 131/70 (BP Location: Right Arm)   Pulse 72   Temp 98.5 F (36.9 C) (Oral)   Ht 5\' 8"  (1.727 m)   Wt 77.1 kg   SpO2 99%   BMI 25.85 kg/m   Physical Exam Vitals signs and nursing note reviewed.  Constitutional:      General: He is not in acute distress.    Appearance: He is well-developed. He is not diaphoretic.  HENT:     Head: Normocephalic and atraumatic.  Eyes:     General: No scleral icterus.    Conjunctiva/sclera: Conjunctivae normal.  Neck:     Musculoskeletal: Normal range of motion.  Pulmonary:     Effort: Pulmonary effort is normal. No respiratory distress.  Musculoskeletal:       Legs:     Comments: TTP and contusion noted of the left shin as indicated in the image.  Full range of motion of ankle, digits, knee.  No calf tenderness.  No warmth of area.  Skin:    Findings: No rash.  Neurological:     Mental Status: He is alert.      ED Treatments / Results  Labs (all labs ordered are listed, but only abnormal results are displayed) Labs Reviewed - No data  to display  EKG None  Radiology Dg Tibia/fibula Left  Result Date: 03/02/2019 CLINICAL DATA:  Left leg injury playing football yesterday. Left leg pain. Initial encounter. EXAM: LEFT TIBIA AND FIBULA - 2 VIEW COMPARISON:  None. FINDINGS: There is no evidence of fracture or other focal bone lesions. Soft tissues are unremarkable. IMPRESSION: Negative. Electronically Signed   By: Marlaine Hind M.D.   On: 03/02/2019 13:04    Procedures Procedures (including critical care time)  Medications Ordered in ED Medications  ibuprofen (ADVIL) tablet 600 mg (has no administration in time range)     Initial Impression / Assessment and Plan / ED Course  I have reviewed the triage vital signs and the nursing notes.  Pertinent labs & imaging results that were available during my care of the  patient were reviewed by me and considered in my medical decision making (see chart for details).        28 year old male presents to ED for left shin pain after being injured while playing football yesterday.  Has a contusion noted to the left shin without any open wounds or changes to range of motion of joints distal or proximal.  No calf tenderness. Reports pain only after the injury.  X-rays negative.  Will advise RICE, NSAIDs and PCP follow-up.  Patient is hemodynamically stable, in NAD, and able to ambulate in the ED. Evaluation does not show pathology that would require ongoing emergent intervention or inpatient treatment. I explained the diagnosis to the patient. Pain has been managed and has no complaints prior to discharge. Patient is comfortable with above plan and is stable for discharge at this time. All questions were answered prior to disposition. Strict return precautions for returning to the ED were discussed. Encouraged follow up with PCP.   An After Visit Summary was printed and given to the patient.   Portions of this note were generated with [employer]. Dictation errors may occur despite best attempts at proofreading.  Final Clinical Impressions(s) / ED Diagnoses   Final diagnoses:  Contusion of left lower leg, initial encounter    ED Discharge Orders         Ordered    naproxen (NAPROSYN) 500 MG tablet  2 times daily     03/02/19 [address] PA-C 03/02/19 1421    Carmin Muskrat, MD 03/03/19 1244

## 2019-09-08 ENCOUNTER — Emergency Department: Admission: EM | Admit: 2019-09-08 | Discharge: 2019-09-08 | Discharge status: Against Medical Advice | Payer: Self-pay

## 2019-09-09 ENCOUNTER — Ambulatory Visit (HOSPITAL_COMMUNITY)
Admission: EM | Admit: 2019-09-09 | Discharge: 2019-09-09 | Disposition: A | Discharge status: Home / Self Care | Payer: Self-pay | Attending: Family Medicine | Admitting: Family Medicine

## 2019-09-09 ENCOUNTER — Ambulatory Visit (INDEPENDENT_AMBULATORY_CARE_PROVIDER_SITE_OTHER): Payer: Self-pay

## 2019-09-09 ENCOUNTER — Other Ambulatory Visit: Payer: Self-pay

## 2019-09-09 ENCOUNTER — Encounter (HOSPITAL_COMMUNITY): Payer: Self-pay | Admitting: Family Medicine

## 2019-09-09 DIAGNOSIS — S6992XA Unspecified injury of left wrist, hand and finger(s), initial encounter: Secondary | ICD-10-CM

## 2019-09-09 DIAGNOSIS — M79642 Pain in left hand: Secondary | ICD-10-CM

## 2019-09-09 MED ORDER — IBUPROFEN 600 MG PO TABS: 600.0000 mg | ORAL_TABLET | Freq: Three times a day (TID) | ORAL | 0 refills | Status: DC | PRN

## 2019-09-09 NOTE — Discharge Instructions (Signed)
We will place you in a splint here today.  Keep icing the hand multiple times a day. Rest as much as possible and ibuprofen every 8 hours for pain, inflammation and swelling. If this problem continues you will need to follow-up with a hand specialist.   Contact placed on discharge instructions.

## 2019-09-09 NOTE — ED Triage Notes (Signed)
Pt presents with left thumb injury from sports injury on Saturday.

## 2019-09-10 NOTE — ED Provider Notes (Signed)
[place]    CSN: [id number] Arrival date & time: 09/09/19  1301      History   Chief Complaint Chief Complaint  Patient presents with  . Thumb Injury    HPI Charles Rodriguez is a 29 y.o. male.   Patient is a 29 year old male who presents today with left thumb pain, swelling from injury.  Reporting he was playing football and on Saturday and jammed the left thumb.  He has had very limited range of motion and swelling.  Denies any decrease sensation, numbness or tingling.  He has been icing the hand and resting.   ROS per HPI      History reviewed. No pertinent past medical history.  Patient Active Problem List   Diagnosis Date Noted  . Acute appendicitis 10/02/2018  . Appendicitis 10/02/2018    Past Surgical History:  Procedure Laterality Date  . LAPAROSCOPIC APPENDECTOMY N/A 10/02/2018   Procedure: APPENDECTOMY LAPAROSCOPIC;  Surgeon: Jules Husbands, MD;  Location: [facility] ORS;  Service: General;  Laterality: N/A;  . NECK SURGERY         Home Medications    Prior to Admission medications   Medication Sig Start Date End Date Taking? Authorizing Provider  ibuprofen (ADVIL) 600 MG tablet Take 1 tablet (600 mg total) by mouth every 8 (eight) hours as needed for moderate pain. 09/09/19   Orvan July, NP    Family History Family History  Family history unknown: Yes    Social History Social History   Tobacco Use  . Smoking status: Current Every Day Smoker    Packs/day: 1.00    Types: Cigars  . Smokeless tobacco: Never Used  Substance Use Topics  . Alcohol use: Yes    Comment: occasional  . Drug use: No     Allergies   Patient has no known allergies.   Review of Systems Review of Systems   Physical Exam Triage Vital Signs ED Triage Vitals  Enc Vitals Group     BP 09/09/19 1410 126/83     Pulse Rate 09/09/19 1410 79     Resp 09/09/19 1410 18     Temp 09/09/19 1410 98.6 F (37 C)     Temp Source 09/09/19 1410 Oral     SpO2  09/09/19 1410 98 %     Weight --      Height --      Head Circumference --      Peak Flow --      Pain Score 09/09/19 1413 8     Pain Loc --      Pain Edu? --      Excl. in [place]? --    No data found.  Updated Vital Signs BP 126/83 (BP Location: Right Arm)   Pulse 79   Temp 98.6 F (37 C) (Oral)   Resp 18   SpO2 98%   Visual Acuity Right Eye Distance:   Left Eye Distance:   Bilateral Distance:    Right Eye Near:   Left Eye Near:    Bilateral Near:     Physical Exam Vitals and nursing note reviewed.  Constitutional:      Appearance: Normal appearance.  HENT:     Head: Normocephalic and atraumatic.     Nose: Nose normal.  Eyes:     Conjunctiva/sclera: Conjunctivae normal.  Pulmonary:     Effort: Pulmonary effort is normal.  Abdominal:     Palpations: Abdomen is soft.     Tenderness:  There is no abdominal tenderness.  Musculoskeletal:        General: Normal range of motion.       Hands:     Cervical back: Normal range of motion.     Comments: TTP with moderate swelling Very limited ROM of thumb.  Color temperature normal.  2+ radial pulse Sensation intact  Skin:    General: Skin is warm and dry.  Neurological:     Mental Status: He is alert.  Psychiatric:        Mood and Affect: Mood normal.      UC Treatments / Results  Labs (all labs ordered are listed, but only abnormal results are displayed) Labs Reviewed - No data to display  EKG   Radiology DG Finger Thumb Left  Result Date: 09/09/2019 CLINICAL DATA:  Left thumb pain, sports injury EXAM: LEFT THUMB 2+V COMPARISON:  None. FINDINGS: There is no evidence of fracture or dislocation. There is no evidence of arthropathy or other focal bone abnormality. Soft tissues are unremarkable. IMPRESSION: Negative. Electronically Signed   By: Rolm Baptise M.D.   On: 09/09/2019 14:57    Procedures Procedures (including critical care time)  Medications Ordered in UC Medications - No data to  display  Initial Impression / Assessment and Plan / UC Course  I have reviewed the triage vital signs and the nursing notes.  Pertinent labs & imaging results that were available during my care of the patient were reviewed by me and considered in my medical decision making (see chart for details).     Left hand injury X-ray negative for any fracture Most likely bad ligamentous sprain Will place in thumb spica splint and have him rest, ice, elevate 600 mg ibuprofen every 8 hours for pain Contact given for hand specialist for follow-up Final Clinical Impressions(s) / UC Diagnoses   Final diagnoses:  Injury of left hand, initial encounter     Discharge Instructions     We will place you in a splint here today.  Keep icing the hand multiple times a day. Rest as much as possible and ibuprofen every 8 hours for pain, inflammation and swelling. If this problem continues you will need to follow-up with a hand specialist.   Contact placed on discharge instructions.    ED Prescriptions    Medication Sig Dispense Auth. Provider   ibuprofen (ADVIL) 600 MG tablet Take 1 tablet (600 mg total) by mouth every 8 (eight) hours as needed for moderate pain. 30 tablet Loura Halt A, NP     PDMP not reviewed this encounter.   Orvan July, NP 09/10/19 [phone]

## 2020-06-26 ENCOUNTER — Emergency Department (HOSPITAL_COMMUNITY)
Admission: EM | Admit: 2020-06-26 | Discharge: 2020-06-27 | Disposition: A | Discharge status: Home / Self Care | Payer: Self-pay | Attending: Emergency Medicine | Admitting: Emergency Medicine

## 2020-06-26 ENCOUNTER — Other Ambulatory Visit: Payer: Self-pay

## 2020-06-26 ENCOUNTER — Emergency Department (HOSPITAL_COMMUNITY): Payer: Self-pay

## 2020-06-26 DIAGNOSIS — Y9289 Other specified places as the place of occurrence of the external cause: Secondary | ICD-10-CM | POA: Insufficient documentation

## 2020-06-26 DIAGNOSIS — W2101XA Struck by football, initial encounter: Secondary | ICD-10-CM | POA: Insufficient documentation

## 2020-06-26 DIAGNOSIS — M79644 Pain in right finger(s): Secondary | ICD-10-CM | POA: Insufficient documentation

## 2020-06-26 DIAGNOSIS — Z5321 Procedure and treatment not carried out due to patient leaving prior to being seen by health care provider: Secondary | ICD-10-CM | POA: Insufficient documentation

## 2020-06-26 DIAGNOSIS — Y9389 Activity, other specified: Secondary | ICD-10-CM | POA: Insufficient documentation

## 2020-06-26 DIAGNOSIS — Y999 Unspecified external cause status: Secondary | ICD-10-CM | POA: Insufficient documentation

## 2020-06-26 NOTE — ED Triage Notes (Signed)
Pt presents to ED POv. Pt c/o R thumb injury. Pt reports that he caught football incorrectly earlier today and has had pain since. R radial pulse +2

## 2020-06-27 ENCOUNTER — Other Ambulatory Visit: Payer: Self-pay

## 2020-06-27 ENCOUNTER — Encounter: Payer: Self-pay | Admitting: Emergency Medicine

## 2020-06-27 ENCOUNTER — Emergency Department
Admission: EM | Admit: 2020-06-27 | Discharge: 2020-06-27 | Disposition: A | Discharge status: Home / Self Care | Payer: Self-pay | Attending: Emergency Medicine | Admitting: Emergency Medicine

## 2020-06-27 ENCOUNTER — Emergency Department: Payer: Self-pay

## 2020-06-27 DIAGNOSIS — Y92321 Football field as the place of occurrence of the external cause: Secondary | ICD-10-CM | POA: Insufficient documentation

## 2020-06-27 DIAGNOSIS — W230XXA Caught, crushed, jammed, or pinched between moving objects, initial encounter: Secondary | ICD-10-CM | POA: Insufficient documentation

## 2020-06-27 DIAGNOSIS — F17290 Nicotine dependence, other tobacco product, uncomplicated: Secondary | ICD-10-CM | POA: Insufficient documentation

## 2020-06-27 DIAGNOSIS — S63621A Sprain of interphalangeal joint of right thumb, initial encounter: Secondary | ICD-10-CM | POA: Insufficient documentation

## 2020-06-27 MED ORDER — MELOXICAM 15 MG PO TABS: 15.0000 mg | ORAL_TABLET | Freq: Every day | ORAL | 0 refills | Status: AC

## 2020-06-27 NOTE — ED Triage Notes (Signed)
Pt reports was playing football yesterday and hurt his right hand thumb. Pt states felt something pop in his thumb when he went to catch the ball.

## 2020-06-27 NOTE — ED Notes (Signed)
Left on own accord

## 2020-06-27 NOTE — ED Provider Notes (Signed)
[facility] EMERGENCY DEPARTMENT Provider Note   CSN: [id number] Arrival date & time: 06/27/20  1121     History Chief Complaint  Patient presents with  . Hand Injury    Charles Rodriguez is a 30 y.o. male presents to the emergency department for evaluation of right thumb pain.  Yesterday was playing football and caught a pass, jammed his right thumb.  Having pain along the metacarpal phalangeal joint of the right thumb.  Denies any numbness or tingling.  Pain is moderate.  Is been taking some ibuprofen with little relief.  No other injury throughout his body.  HPI     History reviewed. No pertinent past medical history.  Patient Active Problem List   Diagnosis Date Noted  . Acute appendicitis 10/02/2018  . Appendicitis 10/02/2018    Past Surgical History:  Procedure Laterality Date  . LAPAROSCOPIC APPENDECTOMY N/A 10/02/2018   Procedure: APPENDECTOMY LAPAROSCOPIC;  Surgeon: Jules Husbands, MD;  Location: [facility] ORS;  Service: General;  Laterality: N/A;  . NECK SURGERY         Family History  Family history unknown: Yes    Social History   Tobacco Use  . Smoking status: Current Every Day Smoker    Packs/day: 1.00    Types: Cigars  . Smokeless tobacco: Never Used  Vaping Use  . Vaping Use: Never used  Substance Use Topics  . Alcohol use: Yes    Comment: occasional  . Drug use: No    Home Medications Prior to Admission medications   Medication Sig Start Date End Date Taking? Authorizing Provider  meloxicam (MOBIC) 15 MG tablet Take 1 tablet (15 mg total) by mouth daily. 06/27/20 06/27/21 Yes Duanne Guess, PA-C    Allergies    Patient has no known allergies.  Review of Systems   Review of Systems  Musculoskeletal: Positive for arthralgias and joint swelling.  Skin: Negative for rash and wound.  Neurological: Negative for numbness.    Physical Exam Updated Vital Signs BP 122/75 (BP Location: Right Arm)   Pulse 91   Temp 98.3 F  (36.8 C) (Oral)   Resp 17   Ht 5\' 8"  (1.727 m)   Wt 83.9 kg   SpO2 97%   BMI 28.13 kg/m   Physical Exam Constitutional:      Appearance: He is well-developed and well-nourished.  HENT:     Head: Normocephalic and atraumatic.  Eyes:     Conjunctiva/sclera: Conjunctivae normal.  Cardiovascular:     Rate and Rhythm: Normal rate.  Pulmonary:     Effort: Pulmonary effort is normal. No respiratory distress.  Musculoskeletal:        General: Normal range of motion.     Cervical back: Normal range of motion.     Comments: Examination of the right hand shows swelling along the base of the thumb.  No deformity.  Swelling extends up to the interphalangeal joint he does have active flexion and extension of the right thumb.  He has pain along the ulnar collateral ligament of the right thumb and has slight laxity with moderate pain with stress of the ulnar collateral ligament.  Skin:    General: Skin is warm.     Findings: No rash.  Neurological:     Mental Status: He is alert and oriented to person, place, and time.  Psychiatric:        Mood and Affect: Mood and affect normal.        Behavior:  Behavior normal.        Thought Content: Thought content normal.     ED Results / Procedures / Treatments   Labs (all labs ordered are listed, but only abnormal results are displayed) Labs Reviewed - No data to display  EKG None  Radiology DG Finger Thumb Right  Result Date: 06/26/2020 CLINICAL DATA:  Right thumb injury EXAM: RIGHT THUMB 2+V COMPARISON:  None. FINDINGS: There is no evidence of fracture or dislocation. There is no evidence of arthropathy or other focal bone abnormality. Soft tissue swelling is seen diffusely around the thumb. IMPRESSION: No acute osseous abnormality. Electronically Signed   By: Prudencio Pair M.D.   On: 06/26/2020 23:09    Procedures Procedures   Medications Ordered in ED Medications - No data to display  ED Course  I have reviewed the triage vital  signs and the nursing notes.  Pertinent labs & imaging results that were available during my care of the patient were reviewed by me and considered in my medical decision making (see chart for details).    MDM Rules/Calculators/A&P                          30 year old male with injury to the right thumb 1 day ago.  X-ray showed no evidence of acute bony abnormality.  Physical exam concerning for injury to the ulnar collateral ligament.  Do not suspect complete rupture.  He is placed into a thumb spica splint.  He is placed on meloxicam.  He will ice the area and continue with thumb spica splint and follow-up with orthopedics in 1 week. Final Clinical Impression(s) / ED Diagnoses Final diagnoses:  Sprain of interphalangeal joint of right thumb, initial encounter    Rx / DC Orders ED Discharge Orders         Ordered    meloxicam (MOBIC) 15 MG tablet  Daily        06/27/20 1208           Renata Caprice 06/27/20 1218    Harvest Dark, MD 07/03/20 2234

## 2020-06-27 NOTE — Discharge Instructions (Signed)
Please rest ice and elevate the right hand.  Wear thumb spica splint at all times except for showering.  Take Mobic daily for 10 days.  Avoid ibuprofen or Aleve.  You may take Tylenol for additional pain relief

## 2021-09-10 ENCOUNTER — Other Ambulatory Visit: Payer: Self-pay

## 2021-09-10 ENCOUNTER — Emergency Department (HOSPITAL_COMMUNITY)
Admission: EM | Admit: 2021-09-10 | Discharge: 2021-09-11 | Disposition: A | Discharge status: Home / Self Care | Payer: Self-pay | Attending: Emergency Medicine | Admitting: Emergency Medicine

## 2021-09-10 ENCOUNTER — Encounter (HOSPITAL_COMMUNITY): Payer: Self-pay

## 2021-09-10 DIAGNOSIS — W228XXA Striking against or struck by other objects, initial encounter: Secondary | ICD-10-CM | POA: Insufficient documentation

## 2021-09-10 DIAGNOSIS — S0990XA Unspecified injury of head, initial encounter: Secondary | ICD-10-CM

## 2021-09-10 DIAGNOSIS — S0181XA Laceration without foreign body of other part of head, initial encounter: Secondary | ICD-10-CM | POA: Insufficient documentation

## 2021-09-10 MED ORDER — LIDOCAINE-EPINEPHRINE-TETRACAINE (LET) TOPICAL GEL
3.0000 mL | Freq: Once | TOPICAL | Status: AC
  Administered 2021-09-10: 3 mL via TOPICAL
  Filled 2021-09-10: qty 3

## 2021-09-10 MED ORDER — LIDOCAINE-EPINEPHRINE (PF) 2 %-1:200000 IJ SOLN
20.0000 mL | Freq: Once | INTRAMUSCULAR | Status: AC
  Administered 2021-09-10: 20 mL
  Filled 2021-09-10: qty 20

## 2021-09-10 MED ORDER — IBUPROFEN 800 MG PO TABS
800.0000 mg | ORAL_TABLET | Freq: Once | ORAL | Status: AC
  Administered 2021-09-10: 800 mg via ORAL
  Filled 2021-09-10: qty 1
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
## 2021-09-11 MED ORDER — BACITRACIN ZINC 500 UNIT/GM EX OINT
TOPICAL_OINTMENT | CUTANEOUS | Status: AC
  Administered 2021-09-11: 1
  Filled 2021-09-11: qty 0.9
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
## 2021-09-16 ENCOUNTER — Ambulatory Visit (HOSPITAL_COMMUNITY)
Admission: EM | Admit: 2021-09-16 | Discharge: 2021-09-16 | Disposition: A | Discharge status: Home / Self Care | Payer: Self-pay

## 2021-09-16 NOTE — ED Triage Notes (Signed)
Pt is present today for suture removal  . Denies any pain

## 2021-09-22 IMAGING — CR DG FINGER THUMB 2+V*R*
3 series · 3 of 3 positions shown · non-contrast
Comparison: None.

CLINICAL DATA: Right thumb injury

EXAM:
RIGHT THUMB 2+V

[finger ap]
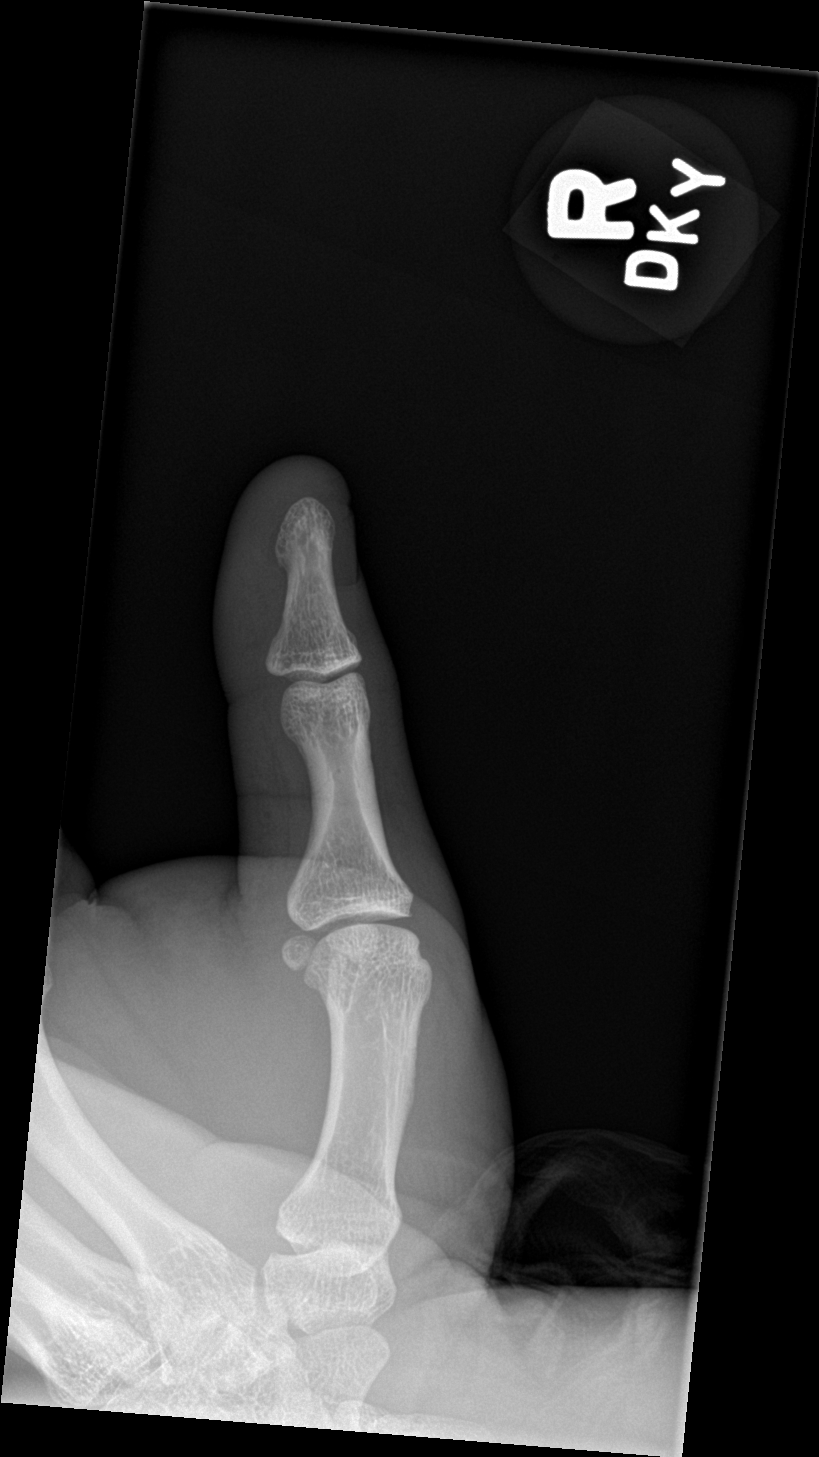

[finger obl]
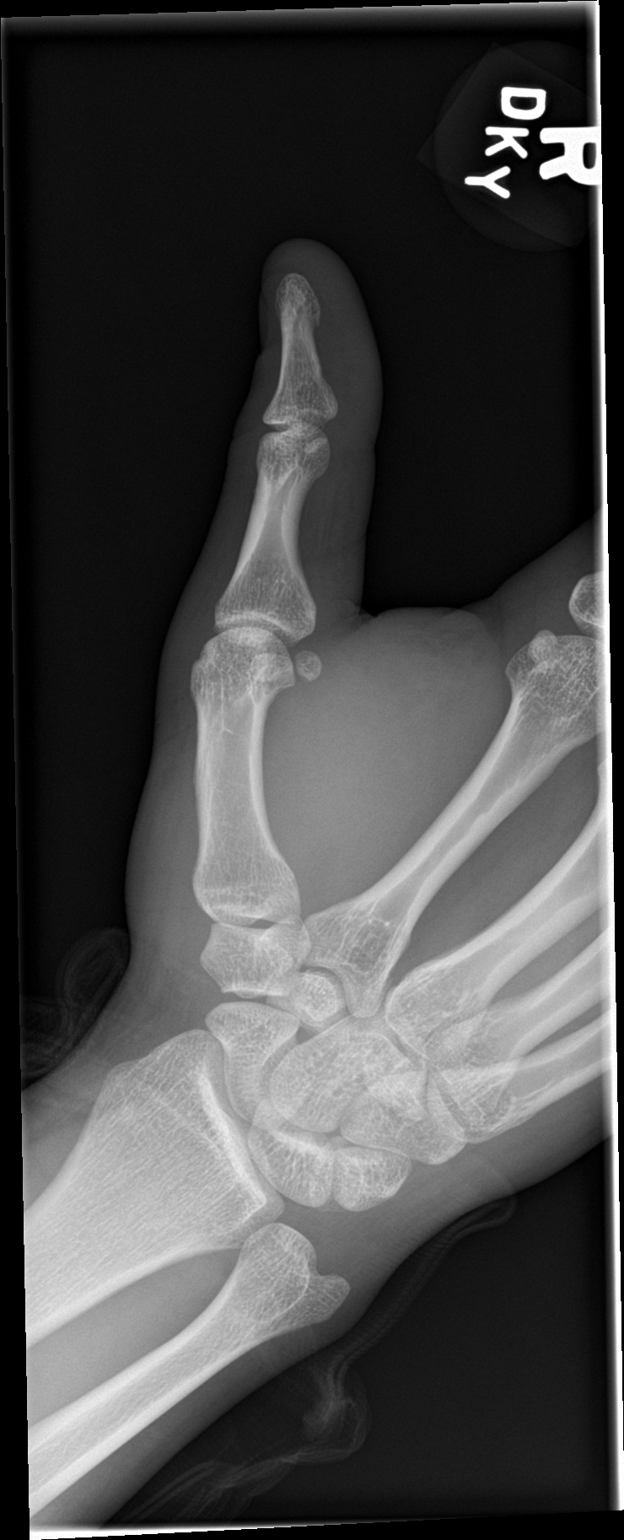

[finger lat]
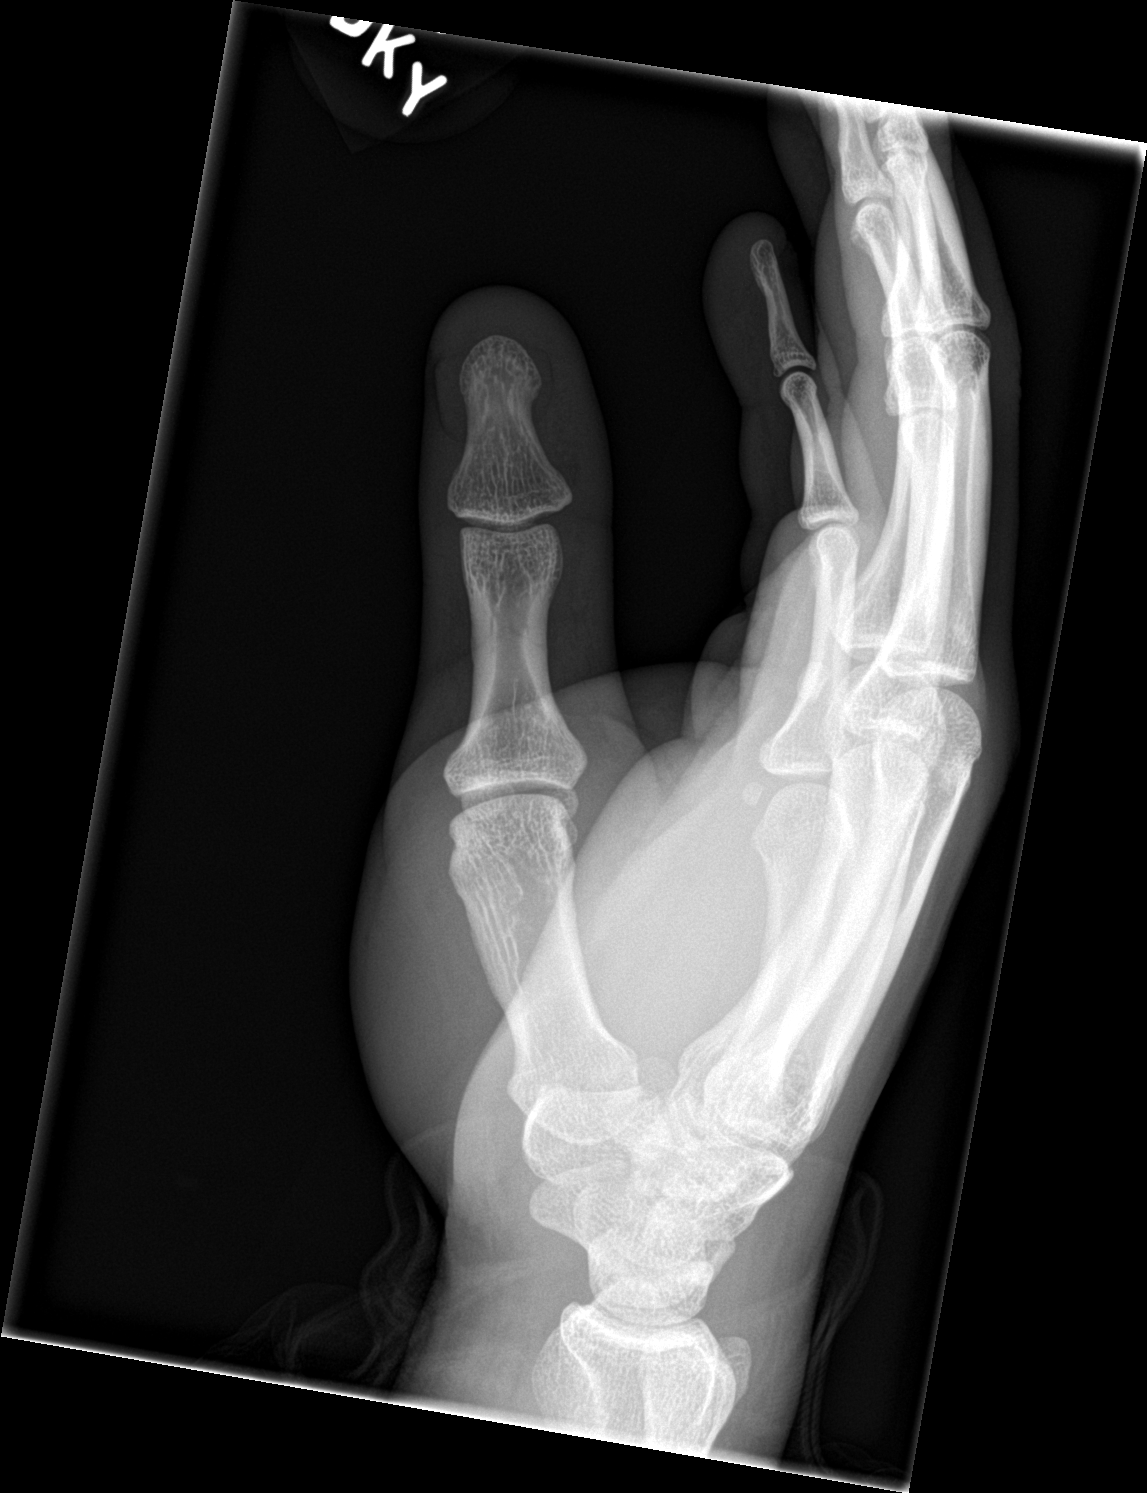

[3 of 3 positions shown; findings below may reference images not displayed]

FINDINGS: There is no evidence of fracture or dislocation. There is no
evidence of arthropathy or other focal bone abnormality. Soft tissue
swelling is seen diffusely around the thumb.
IMPRESSION: No acute osseous abnormality.

## 2021-09-23 IMAGING — DX DG HAND COMPLETE 3+V*R*
3 series · 3 of 3 positions shown · non-contrast
Comparison: June 26, 2020

CLINICAL DATA: Pain after playing ball

EXAM:
RIGHT HAND - COMPLETE 3+ VIEW

[hand ap]
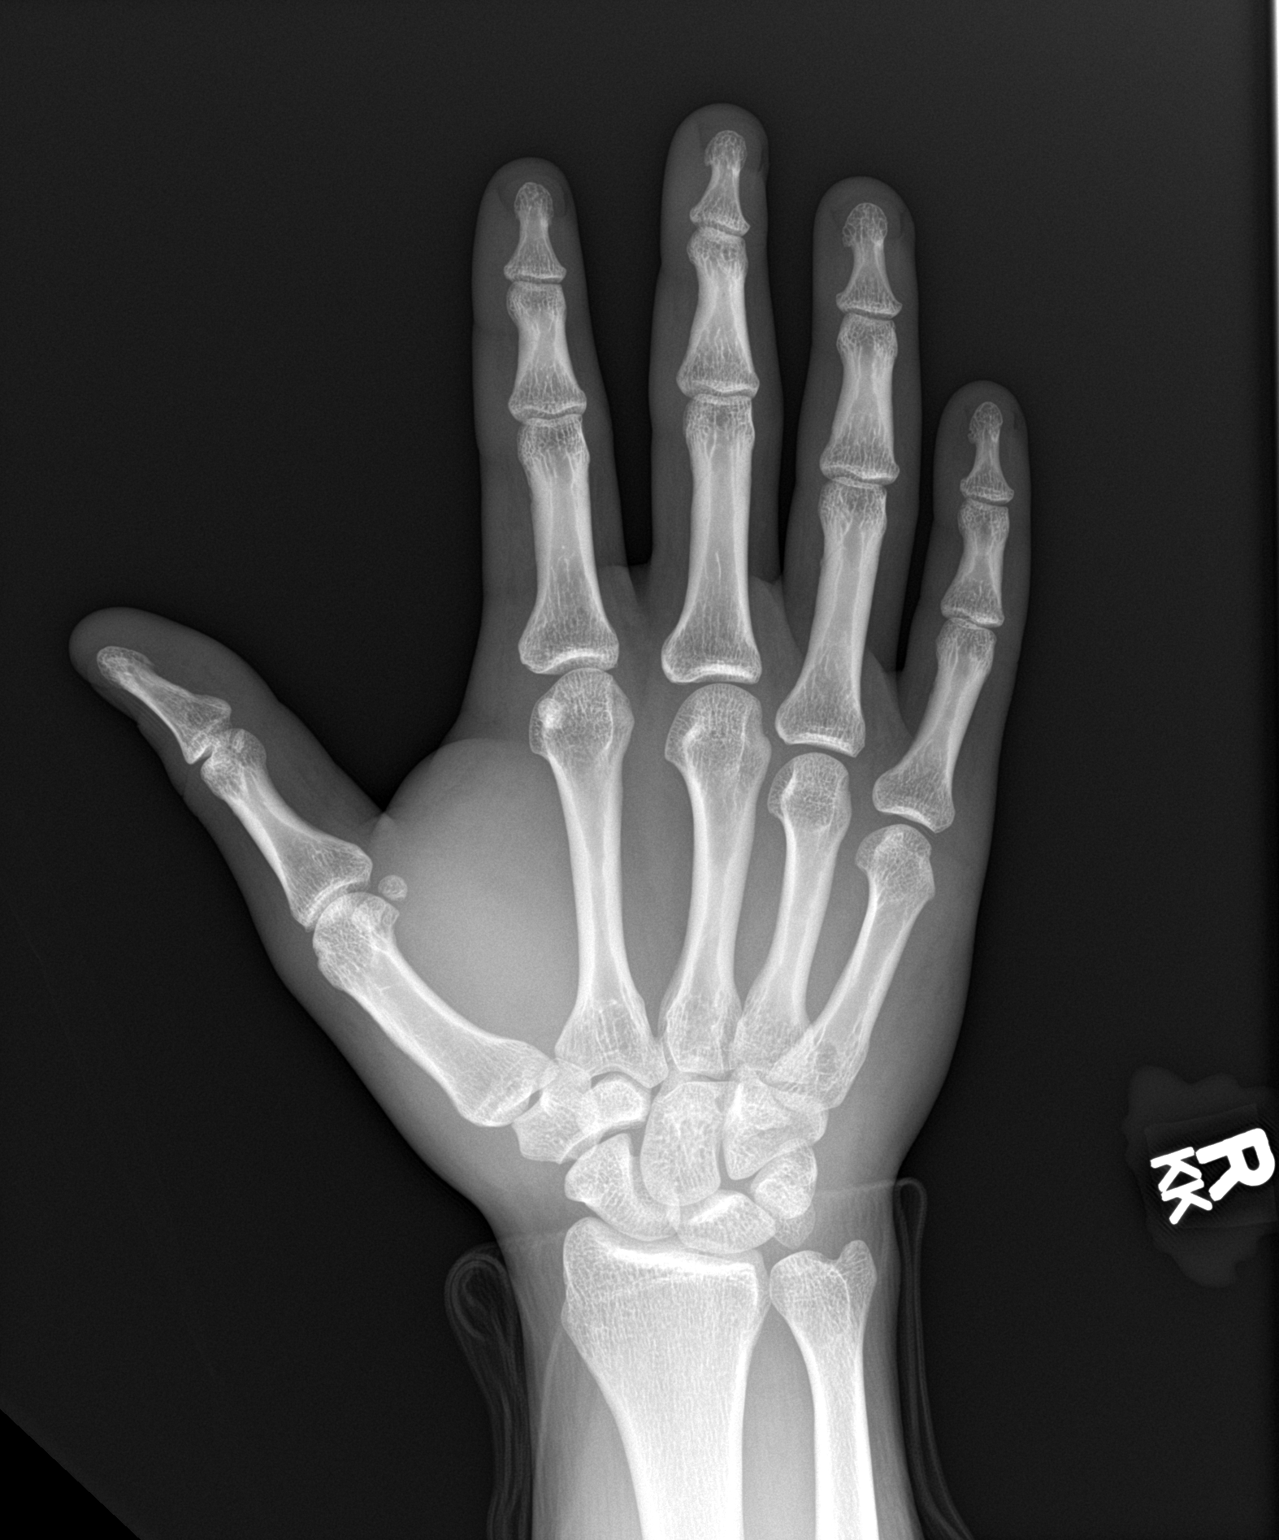

[hand obl]
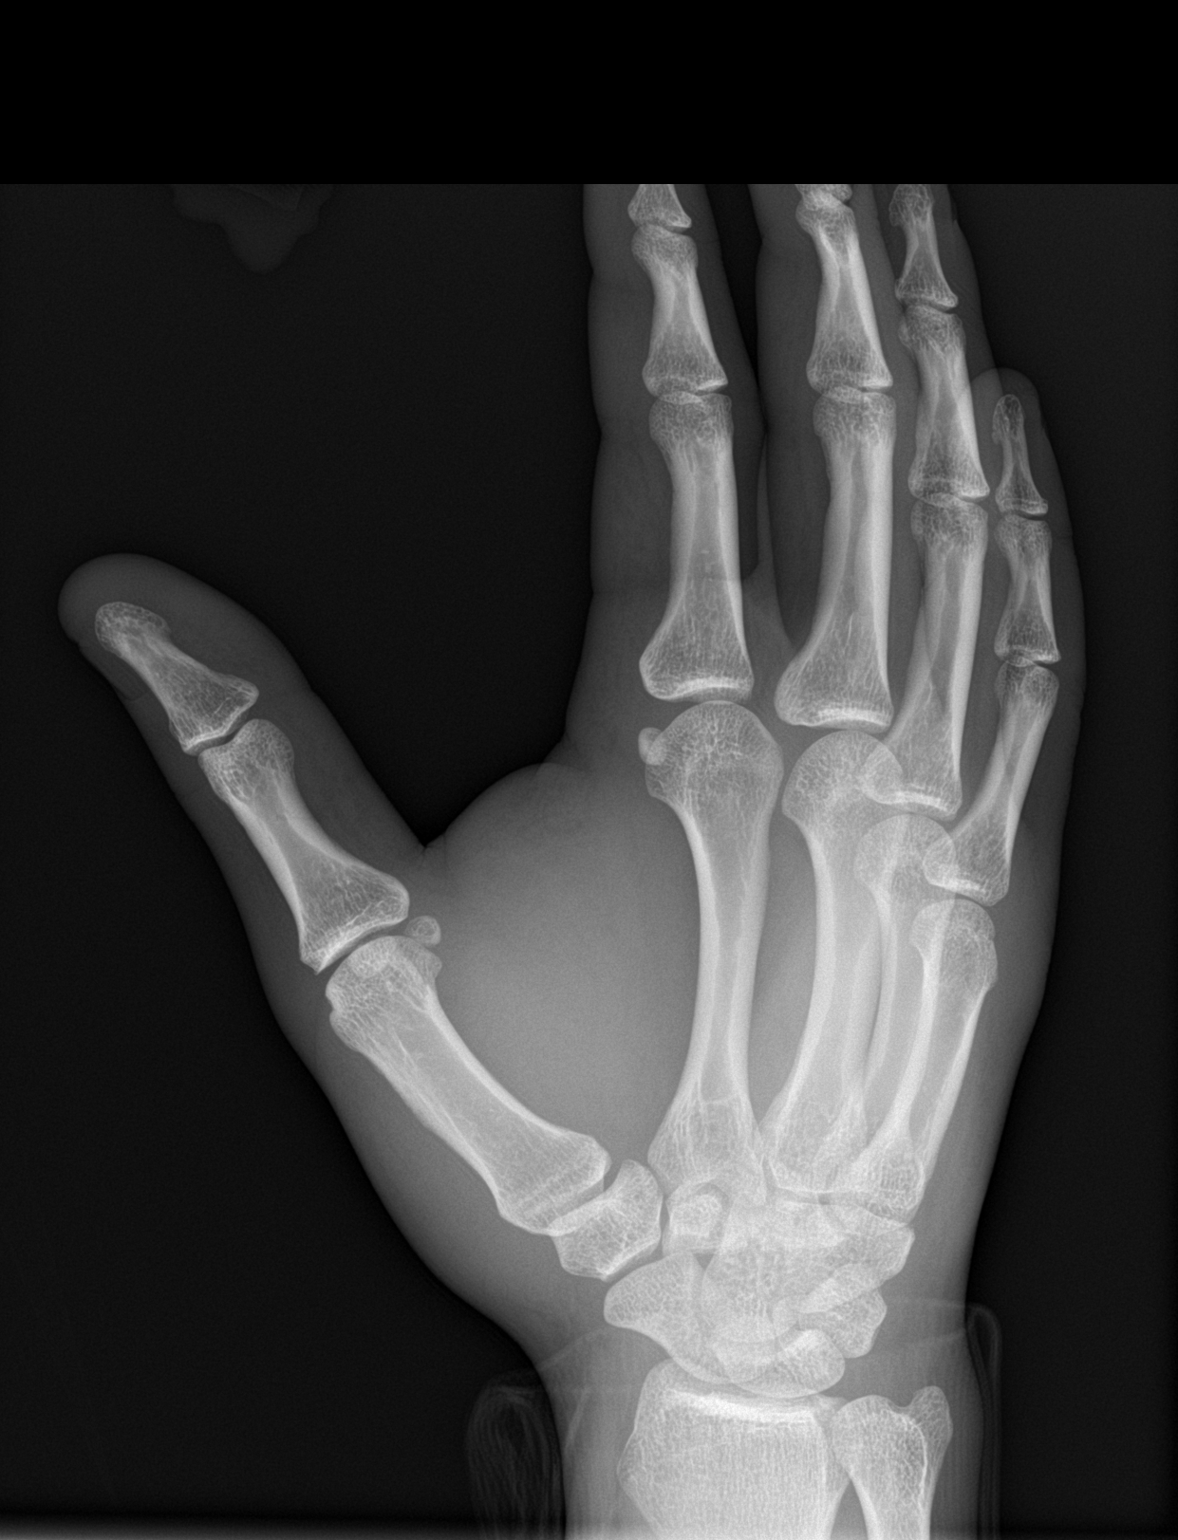

[hand lat]
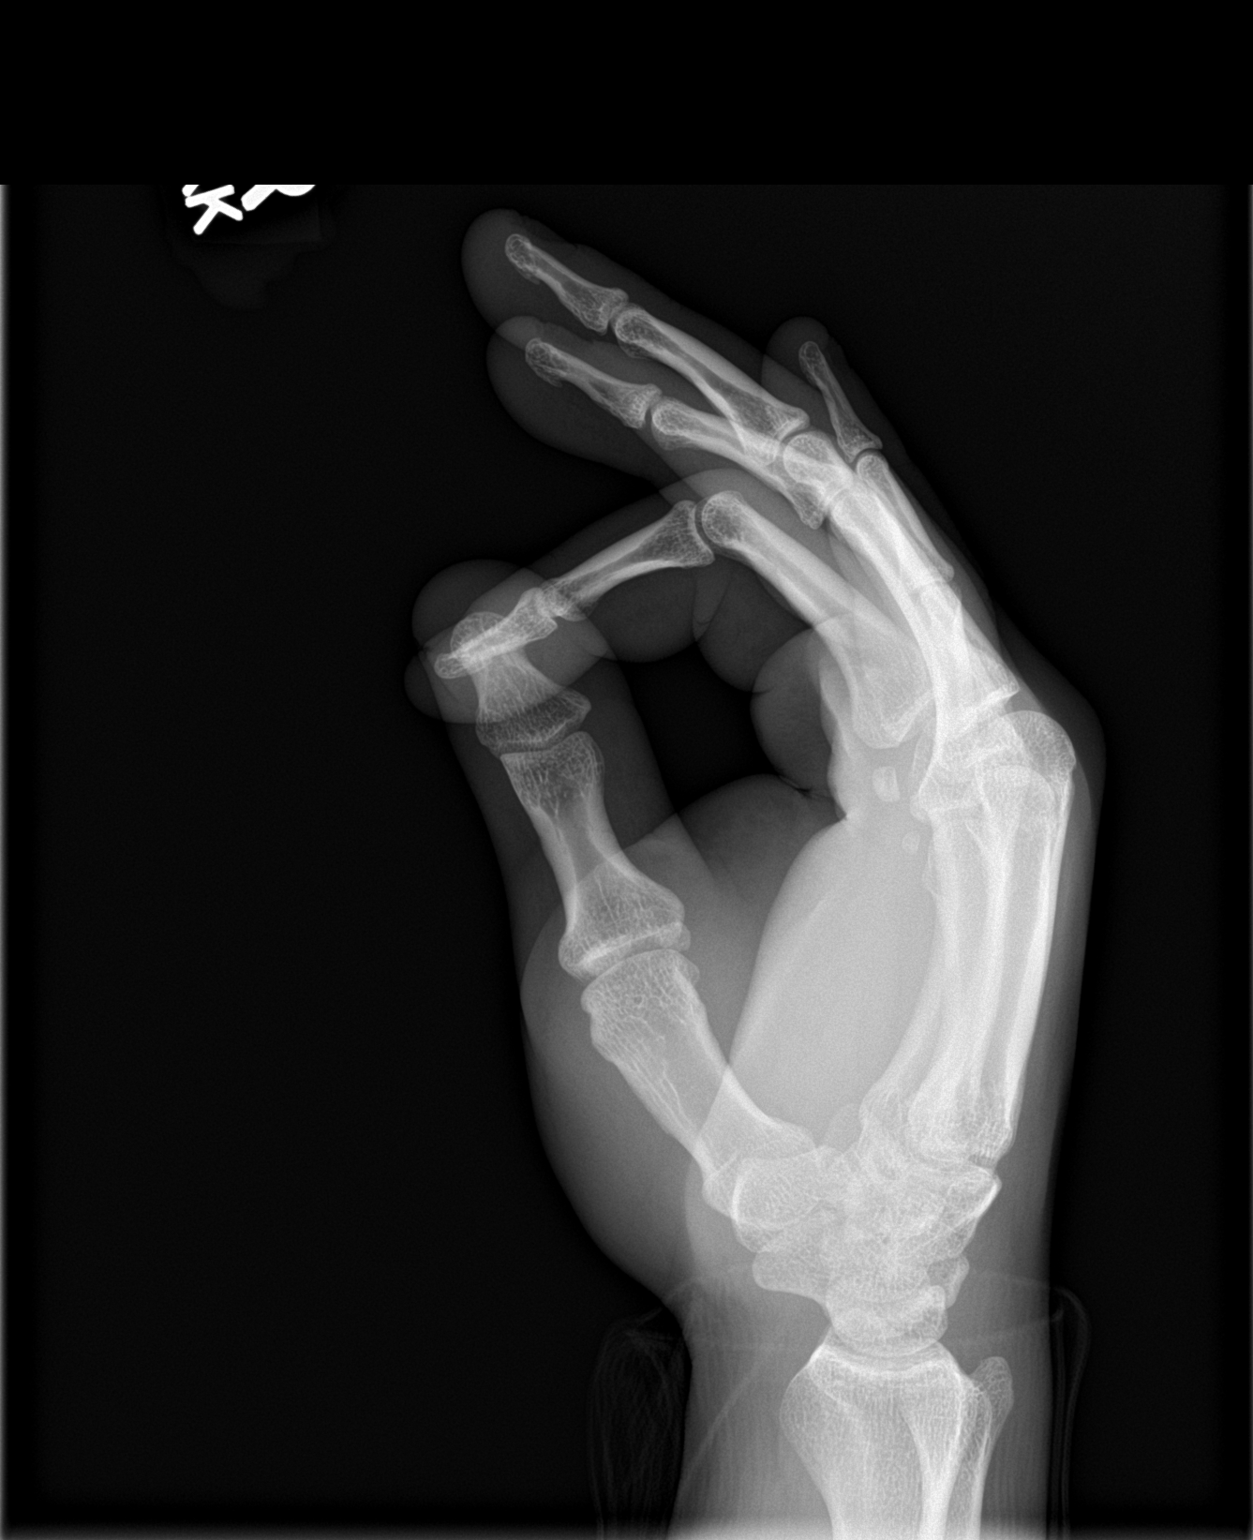

[3 of 3 positions shown; findings below may reference images not displayed]

FINDINGS: No acute fracture or dislocation. Joint spaces and alignment are
maintained. No area of erosion or osseous destruction. No unexpected
radiopaque foreign body. Soft tissues are unremarkable.
IMPRESSION: No acute fracture or dislocation.

If persistent clinical concern for scaphoid fracture, recommend
immobilization and follow-up radiographs in 2 weeks versus MRI.

## 2021-11-04 ENCOUNTER — Encounter (HOSPITAL_COMMUNITY): Payer: Self-pay

## 2021-11-04 ENCOUNTER — Ambulatory Visit (HOSPITAL_COMMUNITY)
Admission: EM | Admit: 2021-11-04 | Discharge: 2021-11-04 | Disposition: A | Discharge status: Home / Self Care | Payer: Self-pay | Attending: Physician Assistant | Admitting: Physician Assistant

## 2021-11-04 DIAGNOSIS — S46911A Strain of unspecified muscle, fascia and tendon at shoulder and upper arm level, right arm, initial encounter: Secondary | ICD-10-CM

## 2021-11-04 DIAGNOSIS — M25511 Pain in right shoulder: Secondary | ICD-10-CM

## 2021-11-04 DIAGNOSIS — M62838 Other muscle spasm: Secondary | ICD-10-CM

## 2021-11-04 MED ORDER — TIZANIDINE HCL 4 MG PO TABS: 4.0000 mg | ORAL_TABLET | Freq: Three times a day (TID) | ORAL | 0 refills | Status: AC

## 2021-11-04 MED ORDER — IBUPROFEN 800 MG PO TABS: 800.0000 mg | ORAL_TABLET | Freq: Three times a day (TID) | ORAL | 0 refills | Status: AC

## 2021-11-04 NOTE — ED Provider Notes (Signed)
[place]    CSN: [id number] Arrival date & time: 11/04/21  0109      History   Chief Complaint Chief Complaint  Patient presents with   Shoulder Pain    HPI Charles Rodriguez is a 31 y.o. male.   31 year old male presents with right shoulder pain patient relates that for the past 3 to 4 days he has been having persistent right shoulder pain.  Patient relates this started when he was lifting heavy boxes in the freezer.  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  Patient indicates that the shoulder pain is the top part of the shoulder radiating into the right side of the neck and the right medial upper scapular area.  Patient relates that the pain is worse when he raises his arm forward and laterally 90 degrees or greater.  Patient indicates that he does have some mild weakness associated with the pain but no numbness or tingling.  Patient indicates he does not recall any trauma to the right shoulder and does not recall being a particular box that caused his shoulder pain to start patient indicates he is taking some aspirin and icing on a regular basis which gives him mild relief.  Patient denies fever or chills, no shortness of breath.   Shoulder Pain Associated symptoms: neck pain (Right side of neck)     History reviewed. No pertinent past medical history.  Patient Active Problem List   Diagnosis Date Noted   Acute appendicitis 10/02/2018   Appendicitis 10/02/2018    Past Surgical History:  Procedure Laterality Date   LAPAROSCOPIC APPENDECTOMY N/A 10/02/2018   Procedure: APPENDECTOMY LAPAROSCOPIC;  Surgeon: Jules Husbands, MD;  Location: [facility] ORS;  Service: General;  Laterality: N/A;   NECK SURGERY         Home Medications    Prior to Admission medications   Medication Sig Start Date End Date Taking? Authorizing Provider  ibuprofen (ADVIL) 800 MG tablet Take 1 tablet (800 mg total) by mouth 3 (three) times  daily. 11/04/21  Yes Nyoka Lint, PA-C  tiZANidine (ZANAFLEX) 4 MG tablet Take 1 tablet (4 mg total) by mouth 3 (three) times daily. 11/04/21  Yes Nyoka Lint, PA-C    Family History Family History  Family history unknown: Yes    Social History Social History   Tobacco Use   Smoking status: Former    Types: Cigars   Smokeless tobacco: Never  Vaping Use   Vaping Use: Never used  Substance Use Topics   Alcohol use: Yes    Comment: occasional   Drug use: No     Allergies   Patient has no known allergies.   Review of Systems Review of Systems  Musculoskeletal:  Positive for arthralgias (Right shoulder pain) and neck pain (Right side of neck).     Physical Exam Triage Vital Signs ED Triage Vitals  Enc Vitals Group     BP 11/04/21 0854 130/77     Pulse Rate 11/04/21 0854 (!) 57     Resp 11/04/21 0854 16     Temp 11/04/21 0854 97.9 F (36.6 C)     Temp Source 11/04/21 0854 Oral     SpO2 11/04/21 0854 98 %     Weight 11/04/21 0853 194 lb (88 kg)     Height 11/04/21 0853 '5\' 9"'$  (1.753 m)     Head Circumference --      Peak  Flow --      Pain Score 11/04/21 0852 8     Pain Loc --      Pain Edu? --      Excl. in [place]? --    No data found.  Updated Vital Signs BP 130/77 (BP Location: Left Arm)   Pulse (!) 57   Temp 97.9 F (36.6 C) (Oral)   Resp 16   Ht '5\' 9"'$  (1.753 m)   Wt 194 lb (88 kg)   SpO2 98%   BMI 28.65 kg/m   Visual Acuity Right Eye Distance:   Left Eye Distance:   Bilateral Distance:    Right Eye Near:   Left Eye Near:    Bilateral Near:     Physical Exam Constitutional:      Appearance: Normal appearance.  Neck:     Comments: Neck: Palpation of the right side of the neck and posteriorly elicit pain, mainly around the trapezius areas.  There is no redness or swelling, no crepitus with range of motion. Cardiovascular:     Rate and Rhythm: Normal rate and regular rhythm.     Heart sounds: Normal heart sounds.  Pulmonary:     Effort:  Pulmonary effort is normal.     Breath sounds: Normal breath sounds and air entry. No wheezing, rhonchi or rales.  Musculoskeletal:     Cervical back: Normal range of motion.     Comments: Right shoulder: There is pain on palpation of the trapezius area and the medial upper scapula, no redness or swelling.  There is pain with resistance abduction, both laterally and forward range of motion is normal with pain elicited at 413 degrees.  Strength is slightly decreased at 4 for 5 out of 5 for 5 as compared to the left.  There is no crepitus with range of motion.  Neurological:     Mental Status: He is alert.      UC Treatments / Results  Labs (all labs ordered are listed, but only abnormal results are displayed) Labs Reviewed - No data to display  EKG   Radiology No results found.  Procedures Procedures (including critical care time)  Medications Ordered in UC Medications - No data to display  Initial Impression / Assessment and Plan / UC Course  I have reviewed the triage vital signs and the nursing notes.  Pertinent labs & imaging results that were available during my care of the patient were reviewed by me and considered in my medical decision making (see chart for details).    Plan: 1.  Advised to continue ice therapy, 10 minutes on 20 minutes off, 4-5 times throughout the day. 2.  Advised take ibuprofen 800 mg 1 every 8 hours with food to help reduce the pain. 3.  Advised to take the Zanaflex to help reduce muscle spasm and irritability until completed. 4.  Advise light duty with limited lifting for the next 2 weeks until shoulder heals. Follow-up with PCP or return to urgent care if symptoms fail to improve. Final Clinical Impressions(s) / UC Diagnoses   Final diagnoses:  Acute pain of right shoulder  Strain of right shoulder, initial encounter  Muscle spasm of shoulder region     Discharge Instructions      Advised to continue icing the area, 10 minutes on 20  minutes off, 4-5 times throughout the day. Advised to take ibuprofen 800 mg 1 every 8 hours with food to help relieve the shoulder discomfort. Advised to take the Zanaflex 1  every 8 hours to reduce muscle spasm and irritability. Advised to follow-up with PCP or return to urgent care if symptoms fail to improve over the next 10 to 14 days. Advised to observe 2 weeks of light duty in order to give the right shoulder time to repair and heal.    ED Prescriptions     Medication Sig Dispense Auth. Provider   ibuprofen (ADVIL) 800 MG tablet Take 1 tablet (800 mg total) by mouth 3 (three) times daily. 30 tablet Nyoka Lint, PA-C   tiZANidine (ZANAFLEX) 4 MG tablet Take 1 tablet (4 mg total) by mouth 3 (three) times daily. 30 tablet Nyoka Lint, PA-C      PDMP not reviewed this encounter.   Nyoka Lint, PA-C 11/04/21 [phone]

## 2021-11-04 NOTE — Discharge Instructions (Addendum)
Advised to continue icing the area, 10 minutes on 20 minutes off, 4-5 times throughout the day. Advised to take ibuprofen 800 mg 1 every 8 hours with food to help relieve the shoulder discomfort. Advised to take the Zanaflex 1 every 8 hours to reduce muscle spasm and irritability. Advised to follow-up with PCP or return to urgent care if symptoms fail to improve over the next 10 to 14 days. Advised to observe 2 weeks of light duty in order to give the right shoulder time to repair and heal.

## 2021-11-04 NOTE — ED Triage Notes (Signed)
States 3-4 days ago onset of pain in the right shoulder radiating up into the neck. States it started hurting after picking up a 60lb load.  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   Patient has a history of torn rotator cuff in the right shoulder a year ago.

## 2021-11-08 ENCOUNTER — Telehealth (HOSPITAL_COMMUNITY): Payer: Self-pay

## 2022-01-22 ENCOUNTER — Emergency Department (HOSPITAL_COMMUNITY)
Admission: EM | Admit: 2022-01-22 | Discharge: 2022-01-23 | Discharge status: Against Medical Advice | Payer: Worker's Compensation | Attending: Student | Admitting: Student

## 2022-01-22 DIAGNOSIS — X500XXA Overexertion from strenuous movement or load, initial encounter: Secondary | ICD-10-CM | POA: Insufficient documentation

## 2022-01-22 DIAGNOSIS — R0789 Other chest pain: Secondary | ICD-10-CM | POA: Diagnosis not present

## 2022-01-22 DIAGNOSIS — Z5321 Procedure and treatment not carried out due to patient leaving prior to being seen by health care provider: Secondary | ICD-10-CM | POA: Diagnosis not present

## 2022-01-22 DIAGNOSIS — S299XXA Unspecified injury of thorax, initial encounter: Secondary | ICD-10-CM | POA: Diagnosis not present

## 2022-01-22 DIAGNOSIS — Y990 Civilian activity done for income or pay: Secondary | ICD-10-CM | POA: Diagnosis not present

## 2022-01-23 ENCOUNTER — Other Ambulatory Visit: Payer: Self-pay

## 2022-01-23 ENCOUNTER — Emergency Department (HOSPITAL_COMMUNITY): Payer: Worker's Compensation

## 2022-01-23 ENCOUNTER — Encounter (HOSPITAL_COMMUNITY): Payer: Self-pay | Admitting: Emergency Medicine

## 2022-01-23 ENCOUNTER — Emergency Department (HOSPITAL_BASED_OUTPATIENT_CLINIC_OR_DEPARTMENT_OTHER)
Admission: EM | Admit: 2022-01-23 | Discharge: 2022-01-23 | Disposition: A | Discharge status: Home / Self Care | Payer: Worker's Compensation | Source: Home / Self Care | Attending: Emergency Medicine | Admitting: Emergency Medicine

## 2022-01-23 ENCOUNTER — Encounter (HOSPITAL_BASED_OUTPATIENT_CLINIC_OR_DEPARTMENT_OTHER): Payer: Self-pay

## 2022-01-23 DIAGNOSIS — R0789 Other chest pain: Secondary | ICD-10-CM

## 2022-01-23 DIAGNOSIS — X500XXA Overexertion from strenuous movement or load, initial encounter: Secondary | ICD-10-CM | POA: Insufficient documentation

## 2022-01-23 DIAGNOSIS — Y990 Civilian activity done for income or pay: Secondary | ICD-10-CM | POA: Insufficient documentation

## 2022-01-23 MED ORDER — IBUPROFEN 400 MG PO TABS
600.0000 mg | ORAL_TABLET | Freq: Once | ORAL | Status: AC
  Administered 2022-01-23: 600 mg via ORAL
  Filled 2022-01-23: qty 1

## 2022-01-23 MED ORDER — KETOROLAC TROMETHAMINE 15 MG/ML IJ SOLN
15.0000 mg | Freq: Once | INTRAMUSCULAR | Status: AC
  Administered 2022-01-23: 15 mg via INTRAMUSCULAR
  Filled 2022-01-23: qty 1

## 2022-01-23 NOTE — ED Notes (Signed)
PATIENT LEFT AMA 

## 2022-01-23 NOTE — ED Notes (Signed)
Patient verbalizes understanding of discharge instructions. Opportunity for questioning and answers were provided. Patient discharged from ED.  °

## 2022-01-23 NOTE — ED Triage Notes (Addendum)
Pt c/o right sided rib pain. Pt states he pulled down 2 pallets and felt a pop in his chest. Pt also needs a return to work form filled out

## 2022-01-23 NOTE — ED Triage Notes (Signed)
Per pt, rib pain that he attributes to moving big pallets at work.  Denies SOB or other symptoms.

## 2022-01-23 NOTE — ED Provider Notes (Signed)
[facility] EMERGENCY DEPT Provider Note   CSN: [id number] Arrival date & time: 01/23/22  1434     History  Chief Complaint  Patient presents with   Rib Injury    Charles Rodriguez is a 31 y.o. male.  31 yo M with a chief complaints of right rib pain.  Yesterday evening Charles Rodriguez said Charles Rodriguez was working and had grabbed onto a pallet and then felt a pop on his right chest wall.  Since then has had pain worse with moving and twisting deep breathing.  Denies overt trauma to the area.  Charles Rodriguez needs a note to go back to work.        Home Medications Prior to Admission medications   Medication Sig Start Date End Date Taking? Authorizing Provider  ibuprofen (ADVIL) 800 MG tablet Take 1 tablet (800 mg total) by mouth 3 (three) times daily. 11/04/21   Nyoka Lint, PA-C  tiZANidine (ZANAFLEX) 4 MG tablet Take 1 tablet (4 mg total) by mouth 3 (three) times daily. 11/04/21   Nyoka Lint, PA-C      Allergies    Patient has no known allergies.    Review of Systems   Review of Systems  Physical Exam Updated Vital Signs BP 124/75 (BP Location: Right Arm)   Pulse 75   Temp 97.7 F (36.5 C) (Oral)   Resp 16   Ht '5\' 8"'$  (1.727 m)   Wt 88.5 kg   SpO2 100%   BMI 29.65 kg/m  Physical Exam Vitals and nursing note reviewed.  Constitutional:      Appearance: Charles Rodriguez is well-developed.  HENT:     Head: Normocephalic and atraumatic.  Eyes:     Pupils: Pupils are equal, round, and reactive to light.  Neck:     Vascular: No JVD.  Cardiovascular:     Rate and Rhythm: Normal rate and regular rhythm.     Heart sounds: No murmur heard.    No friction rub. No gallop.  Pulmonary:     Effort: No respiratory distress.     Breath sounds: No wheezing.  Abdominal:     General: There is no distension.     Tenderness: There is no abdominal tenderness. There is no guarding or rebound.  Musculoskeletal:        General: Tenderness present. Normal range of motion.     Cervical back: Normal range of  motion and neck supple.     Comments: Pain along the right chest wall.  Lung sounds equal bilaterally.  Skin:    Coloration: Skin is not pale.     Findings: No rash.  Neurological:     Mental Status: Charles Rodriguez is alert and oriented to person, place, and time.  Psychiatric:        Behavior: Behavior normal.     ED Results / Procedures / Treatments   Labs (all labs ordered are listed, but only abnormal results are displayed) Labs Reviewed - No data to display  EKG None  Radiology DG Ribs Unilateral W/Chest Right  Result Date: 01/23/2022 CLINICAL DATA:  Chest pain, right rib injury EXAM: RIGHT RIBS AND CHEST - 3+ VIEW COMPARISON:  None Available. FINDINGS: No fracture or other bone lesions are seen involving the ribs. There is no evidence of pneumothorax or pleural effusion. Both lungs are clear. Heart size and mediastinal contours are within normal limits. IMPRESSION: Negative. Electronically Signed   By: Fidela Salisbury M.D.   On: 01/23/2022 00:49    Procedures Procedures  Medications Ordered in ED Medications  ketorolac (TORADOL) 15 MG/ML injection 15 mg (15 mg Intramuscular Given 01/23/22 1635)    ED Course/ Medical Decision Making/ A&P                           Medical Decision Making Risk Prescription drug management.   31 yo M with a chief complaint of right-sided chest wall pain.  This started after Charles Rodriguez tried to lift something heavy at work.  Charles Rodriguez felt a pop.  Charles Rodriguez actually went to [facility] last night, on review of those records Charles Rodriguez did not wait to be seen.  His chest x-ray from that visit was independently interpreted by me without pneumothorax or obvious rib fracture.  Will have him treat supportively with Tylenol and NSAIDs.  Wrote him a note for limited duty at work.  PCP follow-up.  4:41 PM:  I have discussed the diagnosis/risks/treatment options with the patient.  Evaluation and diagnostic testing in the emergency department does not suggest an emergent condition requiring  admission or immediate intervention beyond what has been performed at this time.  They will follow up with PCP. We also discussed returning to the ED immediately if new or worsening sx occur. We discussed the sx which are most concerning (e.g., sudden worsening pain, fever, inability to tolerate by mouth) that necessitate immediate return. Medications administered to the patient during their visit and any new prescriptions provided to the patient are listed below.  Medications given during this visit Medications  ketorolac (TORADOL) 15 MG/ML injection 15 mg (15 mg Intramuscular Given 01/23/22 1635)     The patient appears reasonably screen and/or stabilized for discharge and I doubt any other medical condition or other [facility] requiring further screening, evaluation, or treatment in the ED at this time prior to discharge.          Final Clinical Impression(s) / ED Diagnoses Final diagnoses:  Chest wall pain    Rx / DC Orders ED Discharge Orders     None         Deno Etienne, DO 01/23/22 1641

## 2022-01-23 NOTE — Discharge Instructions (Signed)
Take 4 over the counter ibuprofen tablets 3 times a day or 2 over-the-counter naproxen tablets twice a day for pain. Also take tylenol 1000mg(2 extra strength) four times a day.    

## 2022-01-23 NOTE — ED Provider Triage Note (Signed)
Emergency Medicine Provider Triage Evaluation Note  Charles Rodriguez , a 31 y.o. male  was evaluated in triage.  Pt complains of right-sided rib pain and popping sensation noted when at work today while moving to pallets.  Pain with deep inspiration but no shortness of breath no history of injury to the ribs..  Review of Systems  Positive: Rib pain as above Negative: Shortness of breath chest pain syncope  Physical Exam  There were no vitals taken for this visit. Gen:   Awake, no distress   Resp:  Normal effort  MSK:   Moves extremities without difficulty  Other:  Tenderness palpation of the right anterior inferior ribs with mild soft tissue swelling without skin changes, bruising , lungs CTA B.  Medical Decision Making  Medically screening exam initiated at 12:29 AM.  Appropriate orders placed.  Charles Rodriguez was informed that the remainder of the evaluation will be completed by another provider, this initial triage assessment does not replace that evaluation, and the importance of remaining in the ED until their evaluation is complete.  This chart was dictated using voice recognition software, Dragon. Despite the best efforts of this provider to proofread and correct errors, errors may still occur which can change documentation meaning.    Emeline Darling, PA-C 01/23/22 0036

## 2022-08-06 ENCOUNTER — Emergency Department (HOSPITAL_COMMUNITY)
Admission: EM | Admit: 2022-08-06 | Discharge: 2022-08-06 | Disposition: A | Discharge status: Home / Self Care | Payer: Managed Care, Other (non HMO) | Attending: Emergency Medicine | Admitting: Emergency Medicine

## 2022-08-06 ENCOUNTER — Emergency Department (HOSPITAL_COMMUNITY): Payer: Managed Care, Other (non HMO)

## 2022-08-06 ENCOUNTER — Other Ambulatory Visit: Payer: Self-pay

## 2022-08-06 DIAGNOSIS — Y9361 Activity, american tackle football: Secondary | ICD-10-CM | POA: Diagnosis not present

## 2022-08-06 DIAGNOSIS — X58XXXA Exposure to other specified factors, initial encounter: Secondary | ICD-10-CM | POA: Insufficient documentation

## 2022-08-06 DIAGNOSIS — S93401A Sprain of unspecified ligament of right ankle, initial encounter: Secondary | ICD-10-CM | POA: Insufficient documentation

## 2022-08-06 MED ORDER — IBUPROFEN 400 MG PO TABS
600.0000 mg | ORAL_TABLET | Freq: Once | ORAL | Status: AC
  Administered 2022-08-06: 600 mg via ORAL
  Filled 2022-08-06: qty 1

## 2022-08-06 NOTE — ED Notes (Signed)
Right ankle splinted with a pillow. Ice pack on top. PMS intact.

## 2022-08-06 NOTE — ED Notes (Signed)
Pt is a&ox4, warm and dry to touch. Pt states that he was playing football when he injured his right ankle. Pulse, motor and sensory is present. No other injuries noted or reported.

## 2022-08-06 NOTE — ED Triage Notes (Signed)
Pt injured his right ankle playing football yesterday  Unsure of mechanism of injury.  Could not sleep d/t pain. Swelling noted.

## 2022-08-06 NOTE — Discharge Instructions (Addendum)
Return for any problem.   Rest, apply ice, elevate, use compression, and take ibuprofen as instructed for pain.

## 2022-08-06 NOTE — ED Provider Notes (Signed)
[facility] EMERGENCY DEPARTMENT AT [facility] Provider Note   CSN: [id number] Arrival date & time: 08/06/22  1556     History  Chief Complaint  Patient presents with   Ankle Pain    Charles Rodriguez is a 32 y.o. male.  32 year old male with prior medical history as detailed below presents for evaluation.  Patient injured his right ankle while playing football yesterday.  Patient reports gradual onset of pain overnight.  His right ankle is swollen.  He took ibuprofen with some improvement in his symptoms.  He is requesting x-ray to check for possible fracture.  He denies prior fracture or significant injury to the right ankle.  He is ambulatory with some pain.  He denies other injury.  The history is provided by the patient and medical records.       Home Medications Prior to Admission medications   Medication Sig Start Date End Date Taking? Authorizing Provider  ibuprofen (ADVIL) 800 MG tablet Take 1 tablet (800 mg total) by mouth 3 (three) times daily. 11/04/21   Ellsworth Lennox, PA-C  tiZANidine (ZANAFLEX) 4 MG tablet Take 1 tablet (4 mg total) by mouth 3 (three) times daily. 11/04/21   Ellsworth Lennox, PA-C      Allergies    Patient has no known allergies.    Review of Systems   Review of Systems  All other systems reviewed and are negative.   Physical Exam Updated Vital Signs BP (!) 140/77   Pulse 88   Temp 98.3 F (36.8 C) (Oral)   Resp 14   SpO2 99%  Physical Exam Vitals and nursing note reviewed.  Constitutional:      General: He is not in acute distress.    Appearance: Normal appearance. He is well-developed.  HENT:     Head: Normocephalic and atraumatic.  Eyes:     Conjunctiva/sclera: Conjunctivae normal.     Pupils: Pupils are equal, round, and reactive to light.  Cardiovascular:     Rate and Rhythm: Normal rate and regular rhythm.     Heart sounds: Normal heart sounds.  Pulmonary:     Effort: Pulmonary effort is normal. No respiratory  distress.     Breath sounds: Normal breath sounds.  Abdominal:     General: There is no distension.     Palpations: Abdomen is soft.     Tenderness: There is no abdominal tenderness.  Musculoskeletal:        General: Swelling present. No deformity. Normal range of motion.     Cervical back: Normal range of motion and neck supple.     Comments: Mild tenderness and mild edema overlying the lateral malleolus of the right ankle.  Distal right lower extremity is neurovascular intact.  Patient is ambulatory with minimally antalgic gait.  Skin:    General: Skin is warm and dry.  Neurological:     General: No focal deficit present.     Mental Status: He is alert and oriented to person, place, and time.     ED Results / Procedures / Treatments   Labs (all labs ordered are listed, but only abnormal results are displayed) Labs Reviewed - No data to display  EKG None  Radiology No results found.  Procedures Procedures    Medications Ordered in ED Medications - No data to display  ED Course/ Medical Decision Making/ A&P  Medical Decision Making Amount and/or Complexity of Data Reviewed Radiology: ordered.    Medical Screen Complete  This patient presented to the ED with complaint of right ankle strain.  This complaint involves an extensive number of treatment options. The initial differential diagnosis includes, but is not limited to, strain versus fracture  This presentation is: Acute, Self-Limited, Previously Undiagnosed, and Uncertain Prognosis  Patient presents after injuring his right ankle during a football game yesterday.  Patient with exam most consistent with likely sprain.  X-ray images do not reveal evidence of acute fracture.  Patient is reassured by workup.  Patient understands need for close outpatient follow-up.  Strict return precautions given and understood.   Additional history obtained:  External records from outside  sources obtained and reviewed including prior ED visits and prior Inpatient records.   Imaging Studies ordered:  I ordered imaging studies including right ankle x-ray I independently visualized and interpreted obtained imaging which showed NAD I agree with the radiologist interpretation.   Problem List / ED Course:  Right ankle sprain   Reevaluation:  After the interventions noted above, I reevaluated the patient and found that they have: improved   Disposition:  After consideration of the diagnostic results and the patients response to treatment, I feel that the patent would benefit from close outpatient follow-up.          Final Clinical Impression(s) / ED Diagnoses Final diagnoses:  Sprain of right ankle, unspecified ligament, initial encounter    Rx / DC Orders ED Discharge Orders     None         Wynetta Fines, MD 08/06/22 1718
